# Patient Record
Sex: Female | Born: 1950 | Race: White | Hispanic: No | Marital: Married | Smoking: Never smoker
Health system: Southern US, Community
[De-identification: ages and names within clinical notes are randomized; demographics above are authoritative.]

## PROBLEM LIST (undated history)

## (undated) DIAGNOSIS — R030 Elevated blood-pressure reading, without diagnosis of hypertension: Secondary | ICD-10-CM

## (undated) DIAGNOSIS — F329 Major depressive disorder, single episode, unspecified: Secondary | ICD-10-CM

## (undated) DIAGNOSIS — Z8669 Personal history of other diseases of the nervous system and sense organs: Secondary | ICD-10-CM

## (undated) DIAGNOSIS — K5720 Diverticulitis of large intestine with perforation and abscess without bleeding: Secondary | ICD-10-CM

## (undated) DIAGNOSIS — F32A Depression, unspecified: Secondary | ICD-10-CM

## (undated) DIAGNOSIS — N938 Other specified abnormal uterine and vaginal bleeding: Secondary | ICD-10-CM

## (undated) DIAGNOSIS — Z8249 Family history of ischemic heart disease and other diseases of the circulatory system: Secondary | ICD-10-CM

## (undated) HISTORY — DX: Diverticulitis of large intestine with perforation and abscess without bleeding: K57.20

## (undated) HISTORY — DX: Elevated blood-pressure reading, without diagnosis of hypertension: R03.0

## (undated) HISTORY — DX: Depression, unspecified: F32.A

## (undated) HISTORY — DX: Personal history of other diseases of the nervous system and sense organs: Z86.69

## (undated) HISTORY — DX: Family history of ischemic heart disease and other diseases of the circulatory system: Z82.49

## (undated) HISTORY — DX: Major depressive disorder, single episode, unspecified: F32.9

## (undated) HISTORY — DX: Other specified abnormal uterine and vaginal bleeding: N93.8

## (undated) HISTORY — PX: DILATION AND CURETTAGE OF UTERUS: SHX78

## (undated) HISTORY — PX: APPENDECTOMY: SHX54

---

## 1999-08-10 ENCOUNTER — Encounter: Admission: RE | Admit: 1999-08-10 | Discharge: 1999-08-10 | Payer: Self-pay | Admitting: Family Medicine

## 1999-08-10 ENCOUNTER — Encounter: Payer: Self-pay | Admitting: Family Medicine

## 2000-08-22 ENCOUNTER — Encounter: Admission: RE | Admit: 2000-08-22 | Discharge: 2000-08-22 | Payer: Self-pay | Admitting: Family Medicine

## 2000-08-22 ENCOUNTER — Encounter: Payer: Self-pay | Admitting: Family Medicine

## 2000-10-10 ENCOUNTER — Encounter: Payer: Self-pay | Admitting: Urology

## 2000-10-10 ENCOUNTER — Encounter: Admission: RE | Admit: 2000-10-10 | Discharge: 2000-10-10 | Payer: Self-pay | Admitting: Urology

## 2001-10-19 ENCOUNTER — Encounter: Payer: Self-pay | Admitting: Family Medicine

## 2001-10-19 ENCOUNTER — Encounter: Admission: RE | Admit: 2001-10-19 | Discharge: 2001-10-19 | Payer: Self-pay | Admitting: Family Medicine

## 2002-01-14 ENCOUNTER — Encounter: Admission: RE | Admit: 2002-01-14 | Discharge: 2002-01-14 | Payer: Self-pay | Admitting: Family Medicine

## 2002-01-14 ENCOUNTER — Encounter: Payer: Self-pay | Admitting: Family Medicine

## 2002-02-22 ENCOUNTER — Encounter: Payer: Self-pay | Admitting: Family Medicine

## 2002-02-22 ENCOUNTER — Encounter: Admission: RE | Admit: 2002-02-22 | Discharge: 2002-02-22 | Payer: Self-pay | Admitting: Family Medicine

## 2002-10-28 ENCOUNTER — Encounter: Payer: Self-pay | Admitting: Family Medicine

## 2002-10-28 ENCOUNTER — Encounter: Admission: RE | Admit: 2002-10-28 | Discharge: 2002-10-28 | Payer: Self-pay | Admitting: Family Medicine

## 2002-10-30 ENCOUNTER — Encounter: Admission: RE | Admit: 2002-10-30 | Discharge: 2002-10-30 | Payer: Self-pay | Admitting: Family Medicine

## 2002-10-30 ENCOUNTER — Encounter: Payer: Self-pay | Admitting: Family Medicine

## 2002-12-25 ENCOUNTER — Other Ambulatory Visit: Admission: RE | Admit: 2002-12-25 | Discharge: 2002-12-25 | Payer: Self-pay | Admitting: Family Medicine

## 2003-11-13 ENCOUNTER — Encounter: Admission: RE | Admit: 2003-11-13 | Discharge: 2003-11-13 | Payer: Self-pay | Admitting: Family Medicine

## 2003-12-26 ENCOUNTER — Other Ambulatory Visit: Admission: RE | Admit: 2003-12-26 | Discharge: 2003-12-26 | Payer: Self-pay | Admitting: Family Medicine

## 2004-12-22 ENCOUNTER — Encounter: Admission: RE | Admit: 2004-12-22 | Discharge: 2004-12-22 | Payer: Self-pay | Admitting: Family Medicine

## 2004-12-28 ENCOUNTER — Other Ambulatory Visit: Admission: RE | Admit: 2004-12-28 | Discharge: 2004-12-28 | Payer: Self-pay | Admitting: Family Medicine

## 2005-11-03 ENCOUNTER — Other Ambulatory Visit: Admission: RE | Admit: 2005-11-03 | Discharge: 2005-11-03 | Payer: Self-pay | Admitting: Family Medicine

## 2006-11-09 ENCOUNTER — Other Ambulatory Visit: Admission: RE | Admit: 2006-11-09 | Discharge: 2006-11-09 | Payer: Self-pay | Admitting: Family Medicine

## 2006-11-16 ENCOUNTER — Encounter: Admission: RE | Admit: 2006-11-16 | Discharge: 2006-11-16 | Payer: Self-pay | Admitting: Family Medicine

## 2007-03-09 ENCOUNTER — Inpatient Hospital Stay (HOSPITAL_COMMUNITY): Admission: EM | Admit: 2007-03-09 | Discharge: 2007-03-14 | Payer: Self-pay | Admitting: Emergency Medicine

## 2007-03-09 ENCOUNTER — Encounter: Admission: RE | Admit: 2007-03-09 | Discharge: 2007-03-09 | Payer: Self-pay | Admitting: Family Medicine

## 2007-05-08 ENCOUNTER — Encounter: Admission: RE | Admit: 2007-05-08 | Discharge: 2007-05-08 | Payer: Self-pay | Admitting: Psychology

## 2007-06-20 HISTORY — PX: COLON RESECTION: SHX5231

## 2007-07-02 ENCOUNTER — Inpatient Hospital Stay (HOSPITAL_COMMUNITY): Admission: RE | Admit: 2007-07-02 | Discharge: 2007-07-06 | Payer: Self-pay | Admitting: General Surgery

## 2007-07-02 ENCOUNTER — Encounter (INDEPENDENT_AMBULATORY_CARE_PROVIDER_SITE_OTHER): Payer: Self-pay | Admitting: General Surgery

## 2007-09-20 HISTORY — PX: ABDOMINAL HERNIA REPAIR: SHX539

## 2007-11-28 ENCOUNTER — Other Ambulatory Visit: Admission: RE | Admit: 2007-11-28 | Discharge: 2007-11-28 | Payer: Self-pay | Admitting: Family Medicine

## 2008-02-07 ENCOUNTER — Encounter: Admission: RE | Admit: 2008-02-07 | Discharge: 2008-02-07 | Payer: Self-pay | Admitting: Family Medicine

## 2008-11-28 ENCOUNTER — Other Ambulatory Visit: Admission: RE | Admit: 2008-11-28 | Discharge: 2008-11-28 | Payer: Self-pay | Admitting: Family Medicine

## 2009-01-05 ENCOUNTER — Encounter: Admission: RE | Admit: 2009-01-05 | Discharge: 2009-01-05 | Payer: Self-pay | Admitting: Family Medicine

## 2009-12-28 ENCOUNTER — Other Ambulatory Visit: Admission: RE | Admit: 2009-12-28 | Discharge: 2009-12-28 | Payer: Self-pay | Admitting: Family Medicine

## 2010-02-19 ENCOUNTER — Ambulatory Visit (HOSPITAL_COMMUNITY): Admission: RE | Admit: 2010-02-19 | Discharge: 2010-02-19 | Payer: Self-pay | Admitting: General Surgery

## 2010-10-10 ENCOUNTER — Encounter: Payer: Self-pay | Admitting: Family Medicine

## 2010-12-06 LAB — DIFFERENTIAL
Eosinophils Absolute: 0.1 10*3/uL (ref 0.0–0.7)
Eosinophils Relative: 2 % (ref 0–5)
Lymphocytes Relative: 29 % (ref 12–46)
Lymphs Abs: 1.5 10*3/uL (ref 0.7–4.0)
Monocytes Absolute: 0.4 10*3/uL (ref 0.1–1.0)
Neutro Abs: 2.9 10*3/uL (ref 1.7–7.7)

## 2010-12-06 LAB — CBC
Hemoglobin: 12.1 g/dL (ref 12.0–15.0)
MCV: 93.7 fL (ref 78.0–100.0)
RBC: 3.86 MIL/uL — ABNORMAL LOW (ref 3.87–5.11)
RDW: 13.7 % (ref 11.5–15.5)
WBC: 5 10*3/uL (ref 4.0–10.5)

## 2011-01-13 ENCOUNTER — Other Ambulatory Visit: Payer: Self-pay | Admitting: Family Medicine

## 2011-01-13 DIAGNOSIS — Z1231 Encounter for screening mammogram for malignant neoplasm of breast: Secondary | ICD-10-CM

## 2011-01-14 ENCOUNTER — Other Ambulatory Visit: Payer: Self-pay | Admitting: Obstetrics and Gynecology

## 2011-01-14 DIAGNOSIS — Z1231 Encounter for screening mammogram for malignant neoplasm of breast: Secondary | ICD-10-CM

## 2011-01-17 ENCOUNTER — Ambulatory Visit
Admission: RE | Admit: 2011-01-17 | Discharge: 2011-01-17 | Disposition: A | Discharge status: Home / Self Care | Payer: 59 | Source: Ambulatory Visit | Attending: Family Medicine | Admitting: Family Medicine

## 2011-01-17 DIAGNOSIS — Z1231 Encounter for screening mammogram for malignant neoplasm of breast: Secondary | ICD-10-CM

## 2011-02-01 NOTE — Consult Note (Signed)
[name], [name]                 ACCOUNT NO.:  [id number]   MEDICAL RECORD NO.:  [id number]          PATIENT TYPE:  INP   LOCATION:  5005                         FACILITY:  [facility]   PHYSICIAN:  Cherylynn Ridges, M.D.    DATE OF BIRTH:  May 22, 1951   DATE OF CONSULTATION:  DATE OF DISCHARGE:                                 CONSULTATION   To Whom it May Concern:   Thank you for asking [place] to consult on [name], a 60 year old with a  history of acute diverticulitis confirmed by CT scan today with a  peridiverticular abscess.  According to the patient, the patient has had  the pain about 10 days.  It has progressively been getting worse.  She  has been known to have diverticulosis in the past with previous barium  enema and colonoscopy done by Dr. Russella Dar.  However, she has never had an  acute attack.  She has some chills but no undocumented fevers.  Came  into the emergency room today after being seen by Dr. Arvilla Market.  Was  started on p.o. ciprofloxacin and Flagyl for presumed diverticulitis.  This failed to work, and she came in today for further evaluation.   PAST MEDICAL HISTORY:  Significant for only depression primarily.  She  has no heart, renal, pulmonary disease.   PAST SURGICAL HISTORY:  She has had C-sections x2 and also an  appendectomy for ruptured appendicitis 11 years ago.   ALLERGIES:  SHE HAS NO KNOWN DRUG ALLERGIES.   CURRENT MEDICATIONS:  Include Wellbutrin, trazodone, and Prozac.   REVIEW OF SYSTEMS:  She has had diarrhea in the last several days.  No  blood in her stools.  No nausea or vomiting.   PHYSICAL EXAMINATION:  VITAL SIGNS:  On exam today she is afebrile.  Her  other vital signs are stable.  HEENT:  She is normocephalic and atraumatic and anicteric.  NECK:  Supple.  CHEST:  Clear to auscultation.  CARDIAC:  Regular rhythm and rate with no murmurs.  ABDOMEN:  Distended with hypoactive bowel sounds but some are present.  She has tenderness in the left  lower quadrant and referred tenderness to  the left lower quadrant from the right side.  RECTAL AND PELVIC:  Exams are not performed.   LABORATORY STUDIES:  Her white count is 11,000.  I have reviewed her CT  scan which shows a large area in the mid to distal sigmoid colon with  inflammation with constriction on the lumen and a peridiverticular  abscess.   IMPRESSION:  Acute diverticulitis with a peridiverticular abscess, both  of which appear to be compromised in the lumen of the sigmoid colon.  Although she is not fully obstructed, she is partially obstructed,  likely accounting for her diarrhea.   PLAN:  The plan is to have her admitted to the medicine service.  We  will follow along with her as currently she does not require surgical  intervention.  The abscess cavity is contained.  There is no free  perforation and therefore no need for acute surgical intervention.  I  have warned the patient however that if she should get worse during the  hospitalization or developed free air or evidence of peritonitis, an  urgent operation may be necessary.  She understands the risks of this,  and she will be admitted and started on IV antibiotics.  There is the  possibility that she may require percutaneous drainage of this abscess  also.      Cherylynn Ridges, M.D.  Electronically Signed     JOW/MEDQ  D:  03/09/2007  T:  03/10/2007  Job:  [id number]

## 2011-02-01 NOTE — Discharge Summary (Signed)
[name], [name]                 ACCOUNT NO.:  [id number]   MEDICAL RECORD NO.:  [id number]          PATIENT TYPE:  INP   LOCATION:  5005                         FACILITY:  [facility]   PHYSICIAN:  Kela Millin, M.D.DATE OF BIRTH:  1951-04-14   DATE OF ADMISSION:  03/09/2007  DATE OF DISCHARGE:  03/14/2007                         DISCHARGE SUMMARY - REFERRING   DISCHARGE DIAGNOSES:  1. Diverticulitis with peridiverticular abscess.  2. Depression.   CONSULTATION:  Surgery, Dr. Lindie Spruce.   PROCEDURES AND STUDIES:  1. CT scan of the abdomen and pelvis - diverticulitis with abscess and      microperforation.  2. Follow-up CT scan on March 13, 2007, - improving diverticulitis of      the sigmoid colon.   BRIEF HISTORY:  The patient is a 60 year old with the above listed  medical problems and a history of diverticulosis who presented with  complaints of abdominal pain.  She went to see her primary care  physician and was started on oral Cipro and Flagyl but her symptoms were  worsening, so she came to the ER.  It was reported that patient had had  a colonoscopy three years prior to this presentation which showed  diverticulosis but she had never had any diverticulitis.  She had a CT  scan of her abdomen done which showed the abscess and microperforation  and she was admitted for further evaluation and management.   Please see the dictated admission history and physical on April 08, 2007,  per Dr. Donette Larry for details of the admission physical examination and  laboratory data.   HOSPITAL COURSE:  PROBLEM #1 -  SIGMOID DIVERTICULITIS WITH  MICROPERFORATION AND PERIDIVERTICULAR ABSCESS:  The patient was kept  n.p.o. initially and placed on IV Cipro and Flagyl, hydrated with  intravenous fluids.  Surgery was consulted and [facility] saw the  patient and agreed with this management.  They followed and the patient  continued to improve.  A follow-up CT scan was done and the results as  stated above.  The patient's abdominal pain was improving, so her diet  was gradually advanced.  She was placed on analgesics for pain  management upon admission as well.  With the follow-up CT scan showing  improving diverticulitis and patient also clinically improved, her diet  was advanced to a low residue diet which she tolerated and was  discharged home on oral antibiotics.   PROBLEM #2 -  DEPRESSION:  She was maintained on her outpatient  medications as soon as she was tolerating p.o.   DISCHARGE MEDICATIONS:  1. Cipro 500 p.o. q.12h. for 10 more days.  2. Flagyl 500 mg one p.o. t.i.d.  3. Protonix 40 mg p.o. daily.  4. Percocet one to two p.o. q.4h. p.r.n.   FOLLOW UP CARE:  1. Dr. Lindie Spruce in four weeks.  2. Dr. Arvilla Market in one week.   CONDITION ON DISCHARGE:  Improved/stable.      Kela Millin, M.D.  Electronically Signed     ACV/MEDQ  D:  05/03/2007  T:  05/03/2007  Job:  [id number]   cc:  Donia Guiles, M.D.  Cherylynn Ridges, M.D.

## 2011-02-01 NOTE — Op Note (Signed)
NAMEKORAYMA, HAGWOOD                 ACCOUNT NO.:  [id number]   MEDICAL RECORD NO.:  [id number]          PATIENT TYPE:  INP   LOCATION:  1533                         FACILITY:  [facility]   PHYSICIAN:  Lennie Muckle, MD      DATE OF BIRTH:  August 19, 1951   DATE OF PROCEDURE:  07/02/2007  DATE OF DISCHARGE:                               OPERATIVE REPORT   PROCEDURE:  Sigmoid resection and hernia repair.   PREOPERATIVE DIAGNOSES:  1. Diverticulitis.  2. Incisional hernia.   POSTOPERATIVE DIAGNOSES:  1. Diverticulitis.  2. Incisional hernia.   SURGEON:  Lennie Muckle, M.D.   ASSISTANT:  Ollen Gross. Vernell Morgans, M.D.   ANESTHESIA:  General endotracheal anesthesia.   ESTIMATED BLOOD LOSS:  Approximately 50 mL.   COMPLICATIONS:  None immediate.   DRAINS:  No drains were placed.   FINDINGS:  Diverticula in the sigmoid colon.   SPECIMENS:  Sigmoid.   INDICATIONS FOR PROCEDURE:  Ms. Skalsky is a 60 year old female who had  had a previous hospitalization for diverticulitis with perforation in  June of 2008.  I had seen her in clinic for possible laparoscopic  sigmoid resection.  Due to her previous surgeries and perforation, it  was felt that she would be better suited for an open procedure.  She had  also had complaints of an incisional hernia.  Thus, I elected to perform  both procedures at the same time.  Informed consent was obtained prior  to the procedure.   DESCRIPTION OF PROCEDURE:  Ms. Schexnider was identified in the holding area  and taken to the operating suite.  She received preoperative antibiotics  of Mefoxin.  She was placed in the supine position and given general  endotracheal anesthesia.  After administration of anesthesia, she was  moved to a lithotomy position and placed in [place] stirrups.  Her rectum  and distal sigmoid colon were irrigated with saline and a mixture of  water and Betadine until clear.   A midline incision was then placed through her previous incisional scar  starting proximally above the scar, gaining entry into the abdominal  cavity.  There were adhesions of omentum to the abdominal wall.  These  were taken down with electrocautery.  The lateral attachments on the  sigmoid were identified and taken with the electrocautery and mobilized  distally to an area that was free of diverticula.  The dissection was  continued proximally, with care to visualize the ureter on the left  side.  The splenic flexure was then taken down with blunt dissection and  electrocautery.  I was able to gain enough length through taking down  the splenic flexure for laxity to perform the anastomosis in the pelvis.  There was an adhesion of the omentum around the transverse colon.  This  was taken down with electrocautery without difficulty.  I then picked an  area proximally on the left colon which was free of diverticulum and  disease.  Using a GIA 75 stapler, this was transected.  I also completed  a transection distally on the proximal rectum with  a GIA stapler.  Upon  inspection, there appeared to be enough laxity in the left colon to  perform a side-to-side anastomosis.  A stay suture was placed distally  with silk and proximally, and the enterotomies were placed to position  the stapler to perform the side-to-side anastomosis.  Upon firing, the  staple line was inspected.  There was no evidence of bleeding.  I then  closed our enterotomy sites with 3-0 Vicryl suture and performed a  second layer closure with 3-0 silk sutures.  The abdomen was then  irrigated.  A leak test was performed with Dr. Carolynne Edouard performing a  sigmoidoscopy to instill air d proximally into the anastomotic site.  There was no evidence of bubbling.  The abdomen was irrigated with  approximately 3 liters of saline.  There was no evidence of bleeding at  the end of the case.  The omentum was placed over the area of  anastomosis.  Closure was performed with looped 0 PDS suture.  The skin  was  reapproximated with staples.  Dry gauze and a dressing were placed  for final dressing.   The patient was then extubated, awakened, and transported to the  postanesthesia care unit in stable condition.      Lennie Muckle, MD  Electronically Signed     ALA/MEDQ  D:  07/02/2007  T:  07/02/2007  Job:  [id number]

## 2011-02-01 NOTE — H&P (Signed)
[name], [name]                 ACCOUNT NO.:  [id number]   MEDICAL RECORD NO.:  [id number]          PATIENT TYPE:  EMS   LOCATION:  MAJO                         FACILITY:  [facility]   PHYSICIAN:  Georgann Housekeeper, MD      DATE OF BIRTH:  1951/07/28   DATE OF ADMISSION:  03/09/2007  DATE OF DISCHARGE:                              HISTORY & PHYSICAL   PRIMARY CARE PHYSICIAN:  Dr. Arvilla Market at [facility].   CHIEF COMPLAINT:  Abdominal pain.   A 60 year old female with a history of depression and diverticulosis who  started having abdominal pains from about Tuesday last week.  Went to  see Dr. Arvilla Market on Wednesday.  Was started on p.o. Cipro and Flagyl for  diverticulitis, but she started getting worse and came into the ER with  worsening abdominal pain described at almost 10/10.  No bleeding, no  nausea, vomiting, no blood in the stools, no fevers.  The patient had  colonoscopy three years ago which showed diverticulosis and never had  any acute episode.  She denies any unusual food.  As far as in the ER,  she had CT scan which showed diverticulitis with abscess and  microperforation.  Admit for further care.   PAST MEDICAL HISTORY:  No known drug allergies.   CURRENT MEDICATIONS:  1. Wellbutrin 300 mg daily.  2. Prozac 20 mg daily.  3. Trazodone 100 mg q.h.s.  4. Activella one a day.   SOCIAL HISTORY:  No tobacco, alcohol.  She is married with two children.  Works for the [employer].   PHYSICAL EXAMINATION:  VITAL SIGNS:  Blood pressure 136/97, temperature  98.8, 95 pulse, 97% sats.  LUNGS:  Clear.  HEART:  S1, S2, no murmurs.  EXTREMITIES:  No edema.  ABDOMEN:  Soft, tender, left lower quadrant worse with some mild rebound  without guarding.  Good bowel sounds.   LABORATORY DATA:  White count 11,000, hemoglobin 12.5.  Sodium 134,  potassium 4, creatinine 0.9, and BUN 6.  UA was negative.   As far as the CT scan results as mentioned above.   IMPRESSION:  A  60 year old female with diverticulosis and depression  comes in with acute diverticulitis with microperforation abscess.   PLAN:  Admit.  IV fluids, NPO, IV Cipro, and Flagyl.  Pain medication  with Dilaudid 1 mg q.4-6 p.r.n.  IV Protonix 40 mg. Depression - hold  off medication for now.  Surgical consult - I did talk to Dr. Lindie Walker.  He  said to get conservative management.  If things get worse, might  consider surgical intervention.      Georgann Housekeeper, MD  Electronically Signed     KH/MEDQ  D:  03/09/2007  T:  03/10/2007  Job:  [id number]   cc:   [name], M.D.

## 2011-02-01 NOTE — Discharge Summary (Signed)
[name], [name]                 ACCOUNT NO.:  [id number]   MEDICAL RECORD NO.:  [id number]          PATIENT TYPE:  INP   LOCATION:  1533                         [facility]ACILITY[facility]   PHYSICIAN:  Lennie Muckle, MD      DATE OF BIRTH:  [birth year]   DATE OF ADMISSION:  07/02/2007  DATE OF DISCHARGE:  07/06/2007                               DISCHARGE SUMMARY   DIAGNOSES:  Sigmoid diverticulitis.   PROCEDURE:  Open sigmoid resection.   HOSPITAL COURSE:  [name] was hospitalized after her sigmoid resection  on July 02, 2007.  She was started on a clear-liquid diet the  following day.  Had one bout of emesis; however, was able to be advanced  to a regular diet without difficulty.  She is eating well today, has  pain well controlled with p.o. Percocet.  She has been weaned off her  oxygen.  Has been afebrile.  However, she was known to have erythema in  her incision on July 05, 2007.  The erythema extended today; thus, I  opened up the wound with a minimal amount of purulent drainage from the  lower portion.  The wound was packed with a dry gauze.  She was  instructed on how to take care of her wound with dressings changed twice  daily.  She was instructed to shower daily, wash her incision, do  packing twice daily with moist gauze.  Return to the clinic next week.   DISCHARGE MEDICATIONS:  1. Percocet 5/325 1-2 every 4 hours #45.  2. Augmentin 875 mg take 1 twice daily, #14.  3. Over-the-counter stool softener while on Percocet.  4. She will resume her home medications of Wellbutrin, Prozac, and      trazodone.   She is instructed to call over this weekend if she develops increased  erythema or pain at her incision site.      Lennie Muckle, MD  Electronically Signed     ALA/MEDQ  D:  07/06/2007  T:  07/07/2007  Job:  [id number]

## 2011-06-30 LAB — BASIC METABOLIC PANEL
BUN: 5 — ABNORMAL LOW
CO2: 27
CO2: 31
Chloride: 103
Chloride: 105
GFR calc Af Amer: >60
GFR calc Af Amer: >60
GFR calc non Af Amer: >60
Glucose, Bld: 107 — ABNORMAL HIGH
Potassium: 4.2
Sodium: 140

## 2011-06-30 LAB — ABO/RH: ABO/RH(D): O NEG

## 2011-06-30 LAB — DIFFERENTIAL
Lymphocytes Relative: 19
Lymphs Abs: 1.3
Monocytes Absolute: 0.5
Monocytes Relative: 8
Neutro Abs: 4.9
Neutrophils Relative %: 71

## 2011-06-30 LAB — URINE CULTURE

## 2011-06-30 LAB — PHOSPHORUS: Phosphorus: 3.3

## 2011-06-30 LAB — CBC: Platelets: 265

## 2011-06-30 LAB — TYPE AND SCREEN: Antibody Screen: NEGATIVE

## 2011-07-06 LAB — I-STAT 8, (EC8 V) (CONVERTED LAB)
BUN: 6
Bicarbonate: 28.1 — ABNORMAL HIGH
Glucose, Bld: 94
Hemoglobin: 14.3
Potassium: 4
TCO2: 29
pH, Ven: 7.409 — ABNORMAL HIGH

## 2011-07-06 LAB — DIFFERENTIAL
Basophils Absolute: 0
Basophils Absolute: 0
Basophils Relative: 0
Eosinophils Absolute: 0.1
Eosinophils Absolute: 0.1
Eosinophils Absolute: 0.2
Eosinophils Relative: 1
Eosinophils Relative: 1
Lymphs Abs: 1.7
Monocytes Absolute: 0.5
Monocytes Absolute: 0.8 — ABNORMAL HIGH
Monocytes Relative: 11
Neutro Abs: 3
Neutrophils Relative %: 55

## 2011-07-06 LAB — URINALYSIS, ROUTINE W REFLEX MICROSCOPIC
Glucose, UA: NEGATIVE
Leukocytes, UA: NEGATIVE
Nitrite: NEGATIVE
Protein, ur: NEGATIVE
Urobilinogen, UA: 0.2

## 2011-07-06 LAB — BASIC METABOLIC PANEL
BUN: 2 — ABNORMAL LOW
BUN: 4 — ABNORMAL LOW
CO2: 25
CO2: 28
Calcium: 7.9 — ABNORMAL LOW
Calcium: 8.5
Chloride: 104
Creatinine, Ser: 0.6
GFR calc Af Amer: >60
GFR calc Af Amer: >60
GFR calc non Af Amer: >60
GFR calc non Af Amer: >60
GFR calc non Af Amer: >60
Glucose, Bld: 77
Potassium: 3.5
Potassium: 3.8
Sodium: 137
Sodium: 138

## 2011-07-06 LAB — CBC
HCT: 33.4 — ABNORMAL LOW
HCT: 35.1 — ABNORMAL LOW
HCT: 36.7
Hemoglobin: 11.2 — ABNORMAL LOW
Hemoglobin: 11.7 — ABNORMAL LOW
Hemoglobin: 12.5
MCHC: 33.5
MCHC: 34.1
MCV: 91.7
MCV: 92.7
MCV: 92.8
Platelets: 286
Platelets: 302
Platelets: 347
RBC: 3.66 — ABNORMAL LOW
RBC: 3.78 — ABNORMAL LOW
RDW: 12.8
RDW: 13
WBC: 5.5

## 2011-07-06 LAB — CULTURE, BLOOD (ROUTINE X 2)
Culture: NO GROWTH
Culture: NO GROWTH

## 2011-07-06 LAB — POCT I-STAT CREATININE: Operator id: 270111

## 2011-07-06 LAB — URINE MICROSCOPIC-ADD ON

## 2013-07-11 ENCOUNTER — Other Ambulatory Visit (HOSPITAL_COMMUNITY)
Admission: RE | Admit: 2013-07-11 | Discharge: 2013-07-11 | Disposition: A | Discharge status: Home / Self Care | Payer: 59 | Source: Ambulatory Visit | Attending: Family Medicine | Admitting: Family Medicine

## 2013-07-11 ENCOUNTER — Other Ambulatory Visit: Payer: Self-pay | Admitting: Family Medicine

## 2013-07-11 DIAGNOSIS — Z124 Encounter for screening for malignant neoplasm of cervix: Secondary | ICD-10-CM | POA: Insufficient documentation

## 2016-04-20 ENCOUNTER — Other Ambulatory Visit: Payer: Self-pay | Admitting: Family Medicine

## 2016-04-20 DIAGNOSIS — Z1231 Encounter for screening mammogram for malignant neoplasm of breast: Secondary | ICD-10-CM

## 2016-05-04 ENCOUNTER — Ambulatory Visit: Payer: Self-pay

## 2016-06-01 DIAGNOSIS — M8588 Other specified disorders of bone density and structure, other site: Secondary | ICD-10-CM | POA: Diagnosis not present

## 2016-06-01 DIAGNOSIS — M810 Age-related osteoporosis without current pathological fracture: Secondary | ICD-10-CM | POA: Diagnosis not present

## 2016-08-03 DIAGNOSIS — Z131 Encounter for screening for diabetes mellitus: Secondary | ICD-10-CM | POA: Diagnosis not present

## 2016-08-03 DIAGNOSIS — F39 Unspecified mood [affective] disorder: Secondary | ICD-10-CM | POA: Diagnosis not present

## 2016-08-03 DIAGNOSIS — R311 Benign essential microscopic hematuria: Secondary | ICD-10-CM | POA: Diagnosis not present

## 2016-08-03 DIAGNOSIS — Z23 Encounter for immunization: Secondary | ICD-10-CM | POA: Diagnosis not present

## 2016-08-03 DIAGNOSIS — M1990 Unspecified osteoarthritis, unspecified site: Secondary | ICD-10-CM | POA: Diagnosis not present

## 2016-08-03 DIAGNOSIS — M810 Age-related osteoporosis without current pathological fracture: Secondary | ICD-10-CM | POA: Diagnosis not present

## 2016-08-03 DIAGNOSIS — Z0000 Encounter for general adult medical examination without abnormal findings: Secondary | ICD-10-CM | POA: Diagnosis not present

## 2016-08-03 DIAGNOSIS — E7800 Pure hypercholesterolemia, unspecified: Secondary | ICD-10-CM | POA: Diagnosis not present

## 2016-08-03 DIAGNOSIS — K5720 Diverticulitis of large intestine with perforation and abscess without bleeding: Secondary | ICD-10-CM | POA: Diagnosis not present

## 2016-11-09 ENCOUNTER — Ambulatory Visit: Payer: Self-pay | Admitting: Cardiology

## 2016-11-09 DIAGNOSIS — R0789 Other chest pain: Secondary | ICD-10-CM | POA: Diagnosis not present

## 2016-11-09 DIAGNOSIS — R0989 Other specified symptoms and signs involving the circulatory and respiratory systems: Secondary | ICD-10-CM | POA: Diagnosis not present

## 2016-11-11 ENCOUNTER — Encounter: Payer: Self-pay | Admitting: Cardiology

## 2016-11-14 ENCOUNTER — Encounter: Payer: Self-pay | Admitting: Cardiology

## 2016-11-14 ENCOUNTER — Ambulatory Visit (INDEPENDENT_AMBULATORY_CARE_PROVIDER_SITE_OTHER): Payer: Medicare Other | Admitting: Cardiology

## 2016-11-14 VITALS — BP 146/88 | HR 71 | Ht 62.0 in | Wt 176.2 lb

## 2016-11-14 DIAGNOSIS — R030 Elevated blood-pressure reading, without diagnosis of hypertension: Secondary | ICD-10-CM | POA: Diagnosis not present

## 2016-11-14 DIAGNOSIS — R079 Chest pain, unspecified: Secondary | ICD-10-CM

## 2016-11-14 DIAGNOSIS — Z8249 Family history of ischemic heart disease and other diseases of the circulatory system: Secondary | ICD-10-CM

## 2016-11-14 HISTORY — DX: Elevated blood-pressure reading, without diagnosis of hypertension: R03.0

## 2016-11-14 HISTORY — DX: Family history of ischemic heart disease and other diseases of the circulatory system: Z82.49

## 2016-11-14 NOTE — Progress Notes (Signed)
Cardiology Office Note    Date:  11/14/2016   ID:  [name], DOB [birth year], MRN [id number]  PCP:  Astrid DivineGRIFFIN,ELAINE COLLINS, MD  Cardiologist:  Armanda Magicraci Turner, MD   Chief Complaint  Patient presents with  . Chest Pain    History of Present Illness:  [name] is a 66 y.o. female with a history of depression who is referred here for evaluation of chest pain.  She awakened a few days ago at 3am with pressure and tightness in her chest with no associated belching or sour taste in her mouth. She does not think that she had dinner the night before.   She noticed that her heart rate was also 120bpm while lying in bed but was no irregular.  She had associated nausea or diaphoresis.  She felt the same feeling the next day and felt like she was coming down with a chest cold.  Since then she has continued to have pressure in her chest intermittently when she is laying down and sometimes during the day.  She is under a lot of stress.  Her symptoms are predominantly nonexertional.  EKG at PCP office showed nonspecific T wave abnormality.  Her last LDL was 146 with HDL 94.  She is a nonsmoker but grew up around second hand smoke.  Her Dad died from a ruptured AAA. She rarely will have some LE edema in her ankles. She denies any DOE, dizziness or syncope.     Past Medical History:  Diagnosis Date  . Depression   . Diverticulitis of colon with perforation   . DUB (dysfunctional uterine bleeding)    MENOPAUSAL  . Elevated blood pressure reading 11/14/2016  . Family history of abdominal aortic aneurysm (AAA) 11/14/2016  . History of migraine headaches     Past Surgical History:  Procedure Laterality Date  . ABDOMINAL HERNIA REPAIR  2009  . APPENDECTOMY    . CESAREAN SECTION     x2  . COLON RESECTION  06/2007   FOR DIVERTICULITIS WITH LEAK  . DILATION AND CURETTAGE OF UTERUS      Current Medications: No outpatient prescriptions have been marked as taking for the 11/14/16 encounter (Office  Visit) with Quintella Reichertraci R Turner, MD.    Allergies:   Codeine; Lexapro [escitalopram]; and Vitamin d analogs   Social History   Social History  . Marital status: Married    Spouse name: N/A  . Number of children: N/A  . Years of education: N/A   Social History Main Topics  . Smoking status: Never Smoker  . Smokeless tobacco: Never Used  . Alcohol use Yes  . Drug use: No  . Sexual activity: Not Asked   Other Topics Concern  . None   Social History Narrative  . None     Family History:  The patient's family history includes AAA (abdominal aortic aneurysm) in her father and mother; COPD in her brother; CVA in her paternal grandmother; Epilepsy in her son; Heart attack in her paternal grandfather; Hypercholesterolemia in her mother.   ROS:   Please see the history of present illness.    ROS All other systems reviewed and are negative.  No flowsheet data found.     PHYSICAL EXAM:   VS:  BP (!) 146/88   Pulse 71   Ht 5\' 2"  (1.575 m)   Wt 176 lb 3.2 oz (79.9 kg)   BMI 32.23 kg/m    GEN: Well nourished, well developed, in no acute distress  HEENT: normal  Neck: no JVD, carotid bruits, or masses Cardiac: RRR; no murmurs, rubs, or gallops,no edema.  Intact distal pulses bilaterally.  Respiratory:  clear to auscultation bilaterally, normal work of breathing GI: soft, nontender, nondistended, + BS MS: no deformity or atrophy  Skin: warm and dry, no rash Neuro:  Alert and Oriented x 3, Strength and sensation are intact Psych: euthymic mood, full affect  Wt Readings from Last 3 Encounters:  11/14/16 176 lb 3.2 oz (79.9 kg)      Studies/Labs Reviewed:   EKG:  EKG is ordered today.  The ekg ordered today demonstrates NSR with nonspecific T wave abnormality and LAFB  Recent Labs: No results found for requested labs within last 8760 hours.   Lipid Panel No results found for: CHOL, TRIG, HDL, CHOLHDL, VLDL, LDLCALC, LDLDIRECT  Additional studies/ records that were  reviewed today include:  Office visit notes from PCP    ASSESSMENT:    1. Chest pain, unspecified type   2. Family history of abdominal aortic aneurysm (AAA)   3. Elevated blood pressure reading      PLAN:  In order of problems listed above:  1. Chest pain with typical and atypical features.  I suspect this may be related to GERD.  She has very little CRFs which include post menopausal state and family history of vascular disease.  She never smoked but was exposed to extensive second hand smoke.  I will get a 2D echo to assess LVF and stress myoview to rule out ischemia.  2. Family history of AAA - her father died of a ruptured AAA.  I will get a screening Abdominal US 3. Elevated BP - She says that it was normal at last OV with PCP    Medication Adjustments/Labs and Tests Ordered: Current medicines are reviewed at length with the patient today.  Concerns regarding medicines are outlined above.  Medication changes, Labs and Tests ordered today are listed in the Patient Instructions below.  There are no Patient Instructions on file for this visit.   Signed, Armanda Magic, MD  11/14/2016 2:24 PM    [facility] [address] [place], [place], [place]  [address] Phone: [phone]; Fax: [phone]

## 2016-11-14 NOTE — Patient Instructions (Signed)
Medication Instructions:  Your physician recommends that you continue on your current medications as directed. Please refer to the Current Medication list given to you today.   Labwork: None  Testing/Procedures: Your physician has requested that you have an echocardiogram. Echocardiography is a painless test that uses sound waves to create images of your heart. It provides your doctor with information about the size and shape of your heart and how well your heart's chambers and valves are working. This procedure takes approximately one hour. There are no restrictions for this procedure.   Your physician has requested that you have an abdominal aorta duplex. During this test, an ultrasound is used to evaluate the aorta. Allow 30 minutes for this exam. Do not eat after midnight the day before and avoid carbonated beverages  Dr. Mayford Knifeurner recommends you have a NUCLEAR STRESS TEST.  Follow-Up: Your physician recommends that you schedule a follow-up appointment AS NEEDED with Dr. Mayford Knifeurner pending study results.  Any Other Special Instructions Will Be Listed Below (If Applicable).     If you need a refill on your cardiac medications before your next appointment, please call your pharmacy.

## 2016-11-24 ENCOUNTER — Telehealth (HOSPITAL_COMMUNITY): Payer: Self-pay | Admitting: *Deleted

## 2016-11-24 NOTE — Telephone Encounter (Signed)
Left message on voicemail per DPR in reference to upcoming appointment scheduled on 11/24/16 with detailed instructions given per Myocardial Perfusion Study Information Sheet for the test. LM to arrive 15 minutes early, and that it is imperative to arrive on time for appointment to keep from having the test rescheduled. If you need to cancel or reschedule your appointment, please call the office within 24 hours of your appointment. Failure to do so may result in a cancellation of your appointment, and a $50 no show fee. Phone number given for call back for any questions. Taran Hable Jacqueline    

## 2016-11-29 ENCOUNTER — Other Ambulatory Visit (HOSPITAL_COMMUNITY): Payer: Medicare Other

## 2016-11-29 ENCOUNTER — Encounter (HOSPITAL_COMMUNITY): Payer: Medicare Other

## 2016-12-05 ENCOUNTER — Telehealth (HOSPITAL_COMMUNITY): Payer: Self-pay | Admitting: *Deleted

## 2016-12-05 ENCOUNTER — Ambulatory Visit (HOSPITAL_COMMUNITY)
Admission: RE | Admit: 2016-12-05 | Discharge: 2016-12-05 | Disposition: A | Discharge status: Home / Self Care | Payer: Medicare Other | Source: Ambulatory Visit | Attending: Cardiovascular Disease | Admitting: Cardiovascular Disease

## 2016-12-05 DIAGNOSIS — I771 Stricture of artery: Secondary | ICD-10-CM | POA: Diagnosis not present

## 2016-12-05 DIAGNOSIS — Z8249 Family history of ischemic heart disease and other diseases of the circulatory system: Secondary | ICD-10-CM | POA: Diagnosis not present

## 2016-12-05 DIAGNOSIS — I700 Atherosclerosis of aorta: Secondary | ICD-10-CM | POA: Insufficient documentation

## 2016-12-05 DIAGNOSIS — R030 Elevated blood-pressure reading, without diagnosis of hypertension: Secondary | ICD-10-CM

## 2016-12-05 NOTE — Telephone Encounter (Signed)
Patient given detailed instructions per Myocardial Perfusion Study Information Sheet for the test on 12/07/16 at 0745. Patient notified to arrive 15 minutes early and that it is imperative to arrive on time for appointment to keep from having the test rescheduled.  If you need to cancel or reschedule your appointment, please call the office within 24 hours of your appointment. Failure to do so may result in a cancellation of your appointment, and a $50 no show fee. Patient verbalized understanding.Zabdi Mis, Adelene IdlerCynthia W

## 2016-12-07 ENCOUNTER — Ambulatory Visit (HOSPITAL_COMMUNITY): Payer: Medicare Other | Attending: Cardiology

## 2016-12-07 ENCOUNTER — Other Ambulatory Visit: Payer: Self-pay

## 2016-12-07 ENCOUNTER — Ambulatory Visit (HOSPITAL_BASED_OUTPATIENT_CLINIC_OR_DEPARTMENT_OTHER): Payer: Medicare Other

## 2016-12-07 DIAGNOSIS — Z8249 Family history of ischemic heart disease and other diseases of the circulatory system: Secondary | ICD-10-CM | POA: Insufficient documentation

## 2016-12-07 DIAGNOSIS — R079 Chest pain, unspecified: Secondary | ICD-10-CM

## 2016-12-07 DIAGNOSIS — I081 Rheumatic disorders of both mitral and tricuspid valves: Secondary | ICD-10-CM | POA: Insufficient documentation

## 2016-12-07 LAB — MYOCARDIAL PERFUSION IMAGING
CHL CUP NUCLEAR SDS: 1
CHL CUP NUCLEAR SRS: 1
CHL CUP NUCLEAR SSS: 2
CHL CUP RESTING HR STRESS: 67 {beats}/min
CSEPED: 5 min
CSEPEW: 7 METS
CSEPHR: 105 %
CSEPPHR: 164 {beats}/min
Exercise duration (sec): 1 s
LV dias vol: 88 mL (ref 46–106)
LV sys vol: 34 mL
MPHR: 155 {beats}/min
RATE: 0.35
TID: 0.86

## 2016-12-07 MED ORDER — TECHNETIUM TC 99M TETROFOSMIN IV KIT
10.3000 | PACK | Freq: Once | INTRAVENOUS | Status: AC | PRN
  Administered 2016-12-07: 10.3 via INTRAVENOUS
  Filled 2016-12-07: qty 11

## 2016-12-07 MED ORDER — TECHNETIUM TC 99M TETROFOSMIN IV KIT
32.1000 | PACK | Freq: Once | INTRAVENOUS | Status: AC | PRN
  Administered 2016-12-07: 32.1 via INTRAVENOUS
  Filled 2016-12-07: qty 33

## 2016-12-09 ENCOUNTER — Telehealth: Payer: Self-pay

## 2016-12-09 DIAGNOSIS — I5189 Other ill-defined heart diseases: Secondary | ICD-10-CM

## 2016-12-09 NOTE — Telephone Encounter (Signed)
-----   Message from Quintella Reichertraci R Turner, MD sent at 12/09/2016  1:28 PM EDT ----- No significant PVD  Traci ----- Message ----- From: Henrietta DineKathryn A Leyanna Bittman, RN Sent: 12/09/2016  10:52 AM To: Quintella Reichertraci R Turner, MD  I'm not sure if these results were sent to you - I never received them. Just making sure!

## 2016-12-09 NOTE — Telephone Encounter (Signed)
Notes recorded by Quintella Reichertraci R Turner, MD on 12/07/2016 at 10:48 PM EDT Please have her come in for BNP   Informed patient of results and verbal understanding expressed.  BNP scheduled Wednesday. Patient agrees with treatment plan.

## 2016-12-09 NOTE — Telephone Encounter (Signed)
-----   Message from Quintella Reichertraci R Turner, MD sent at 12/07/2016 10:48 PM EDT ----- Normal LVF with mild LVH and increased LV filling pressure, mild MR/TR

## 2016-12-14 ENCOUNTER — Other Ambulatory Visit: Payer: Medicare Other

## 2017-02-14 ENCOUNTER — Other Ambulatory Visit: Payer: Self-pay | Admitting: Family Medicine

## 2017-02-14 ENCOUNTER — Other Ambulatory Visit (HOSPITAL_COMMUNITY)
Admission: RE | Admit: 2017-02-14 | Discharge: 2017-02-14 | Disposition: A | Discharge status: Home / Self Care | Payer: Medicare Other | Source: Ambulatory Visit | Attending: Family Medicine | Admitting: Family Medicine

## 2017-02-14 DIAGNOSIS — Z124 Encounter for screening for malignant neoplasm of cervix: Secondary | ICD-10-CM | POA: Diagnosis not present

## 2017-02-14 DIAGNOSIS — N939 Abnormal uterine and vaginal bleeding, unspecified: Secondary | ICD-10-CM | POA: Diagnosis not present

## 2017-02-19 LAB — CYTOLOGY - PAP: Diagnosis: NEGATIVE

## 2017-08-08 ENCOUNTER — Ambulatory Visit
Admission: RE | Admit: 2017-08-08 | Discharge: 2017-08-08 | Disposition: A | Discharge status: Home / Self Care | Payer: Medicare Other | Source: Ambulatory Visit | Attending: Family Medicine | Admitting: Family Medicine

## 2017-08-08 ENCOUNTER — Other Ambulatory Visit: Payer: Self-pay | Admitting: Family Medicine

## 2017-08-08 DIAGNOSIS — Z1231 Encounter for screening mammogram for malignant neoplasm of breast: Secondary | ICD-10-CM

## 2017-08-17 DIAGNOSIS — Z1389 Encounter for screening for other disorder: Secondary | ICD-10-CM | POA: Diagnosis not present

## 2017-08-17 DIAGNOSIS — Z131 Encounter for screening for diabetes mellitus: Secondary | ICD-10-CM | POA: Diagnosis not present

## 2017-08-17 DIAGNOSIS — M1990 Unspecified osteoarthritis, unspecified site: Secondary | ICD-10-CM | POA: Diagnosis not present

## 2017-08-17 DIAGNOSIS — Z23 Encounter for immunization: Secondary | ICD-10-CM | POA: Diagnosis not present

## 2017-08-17 DIAGNOSIS — M810 Age-related osteoporosis without current pathological fracture: Secondary | ICD-10-CM | POA: Diagnosis not present

## 2017-08-17 DIAGNOSIS — E7800 Pure hypercholesterolemia, unspecified: Secondary | ICD-10-CM | POA: Diagnosis not present

## 2017-08-17 DIAGNOSIS — N950 Postmenopausal bleeding: Secondary | ICD-10-CM | POA: Diagnosis not present

## 2017-08-17 DIAGNOSIS — K5720 Diverticulitis of large intestine with perforation and abscess without bleeding: Secondary | ICD-10-CM | POA: Diagnosis not present

## 2017-08-17 DIAGNOSIS — Z1159 Encounter for screening for other viral diseases: Secondary | ICD-10-CM | POA: Diagnosis not present

## 2017-08-17 DIAGNOSIS — Z0000 Encounter for general adult medical examination without abnormal findings: Secondary | ICD-10-CM | POA: Diagnosis not present

## 2017-08-17 DIAGNOSIS — F39 Unspecified mood [affective] disorder: Secondary | ICD-10-CM | POA: Diagnosis not present

## 2017-08-17 DIAGNOSIS — G4730 Sleep apnea, unspecified: Secondary | ICD-10-CM | POA: Diagnosis not present

## 2017-08-31 DIAGNOSIS — N950 Postmenopausal bleeding: Secondary | ICD-10-CM | POA: Diagnosis not present

## 2017-09-05 DIAGNOSIS — G4733 Obstructive sleep apnea (adult) (pediatric): Secondary | ICD-10-CM | POA: Diagnosis not present

## 2017-09-07 DIAGNOSIS — Z1211 Encounter for screening for malignant neoplasm of colon: Secondary | ICD-10-CM | POA: Diagnosis not present

## 2017-09-07 DIAGNOSIS — K640 First degree hemorrhoids: Secondary | ICD-10-CM | POA: Diagnosis not present

## 2017-09-07 DIAGNOSIS — K5730 Diverticulosis of large intestine without perforation or abscess without bleeding: Secondary | ICD-10-CM | POA: Diagnosis not present

## 2017-09-07 DIAGNOSIS — Z980 Intestinal bypass and anastomosis status: Secondary | ICD-10-CM | POA: Diagnosis not present

## 2017-09-25 DIAGNOSIS — N950 Postmenopausal bleeding: Secondary | ICD-10-CM | POA: Diagnosis not present

## 2017-09-28 DIAGNOSIS — F39 Unspecified mood [affective] disorder: Secondary | ICD-10-CM | POA: Diagnosis not present

## 2017-09-28 DIAGNOSIS — J069 Acute upper respiratory infection, unspecified: Secondary | ICD-10-CM | POA: Diagnosis not present

## 2017-11-21 DIAGNOSIS — E559 Vitamin D deficiency, unspecified: Secondary | ICD-10-CM | POA: Diagnosis not present

## 2018-02-05 DIAGNOSIS — Z6833 Body mass index (BMI) 33.0-33.9, adult: Secondary | ICD-10-CM | POA: Diagnosis not present

## 2018-02-05 DIAGNOSIS — H9201 Otalgia, right ear: Secondary | ICD-10-CM | POA: Diagnosis not present

## 2018-02-14 DIAGNOSIS — F39 Unspecified mood [affective] disorder: Secondary | ICD-10-CM | POA: Diagnosis not present

## 2018-03-27 DIAGNOSIS — H9209 Otalgia, unspecified ear: Secondary | ICD-10-CM | POA: Diagnosis not present

## 2018-03-27 DIAGNOSIS — R51 Headache: Secondary | ICD-10-CM | POA: Diagnosis not present

## 2018-06-12 DIAGNOSIS — M810 Age-related osteoporosis without current pathological fracture: Secondary | ICD-10-CM | POA: Diagnosis not present

## 2018-07-27 DIAGNOSIS — Z23 Encounter for immunization: Secondary | ICD-10-CM | POA: Diagnosis not present

## 2018-09-14 ENCOUNTER — Other Ambulatory Visit: Payer: Self-pay | Admitting: Family Medicine

## 2018-09-14 DIAGNOSIS — M2550 Pain in unspecified joint: Secondary | ICD-10-CM | POA: Diagnosis not present

## 2018-09-14 DIAGNOSIS — F39 Unspecified mood [affective] disorder: Secondary | ICD-10-CM | POA: Diagnosis not present

## 2018-09-14 DIAGNOSIS — Z0000 Encounter for general adult medical examination without abnormal findings: Secondary | ICD-10-CM | POA: Diagnosis not present

## 2018-09-14 DIAGNOSIS — E559 Vitamin D deficiency, unspecified: Secondary | ICD-10-CM | POA: Diagnosis not present

## 2018-09-14 DIAGNOSIS — E7800 Pure hypercholesterolemia, unspecified: Secondary | ICD-10-CM | POA: Diagnosis not present

## 2018-09-14 DIAGNOSIS — M1990 Unspecified osteoarthritis, unspecified site: Secondary | ICD-10-CM | POA: Diagnosis not present

## 2018-09-14 DIAGNOSIS — R197 Diarrhea, unspecified: Secondary | ICD-10-CM | POA: Diagnosis not present

## 2018-09-14 DIAGNOSIS — M810 Age-related osteoporosis without current pathological fracture: Secondary | ICD-10-CM | POA: Diagnosis not present

## 2018-09-14 DIAGNOSIS — G4733 Obstructive sleep apnea (adult) (pediatric): Secondary | ICD-10-CM | POA: Diagnosis not present

## 2018-09-14 DIAGNOSIS — Z1231 Encounter for screening mammogram for malignant neoplasm of breast: Secondary | ICD-10-CM

## 2018-10-02 DIAGNOSIS — G43909 Migraine, unspecified, not intractable, without status migrainosus: Secondary | ICD-10-CM | POA: Diagnosis not present

## 2018-10-02 DIAGNOSIS — R768 Other specified abnormal immunological findings in serum: Secondary | ICD-10-CM | POA: Diagnosis not present

## 2018-10-02 DIAGNOSIS — M064 Inflammatory polyarthropathy: Secondary | ICD-10-CM | POA: Diagnosis not present

## 2018-10-02 DIAGNOSIS — M5030 Other cervical disc degeneration, unspecified cervical region: Secondary | ICD-10-CM | POA: Diagnosis not present

## 2018-10-02 DIAGNOSIS — M79641 Pain in right hand: Secondary | ICD-10-CM | POA: Diagnosis not present

## 2018-10-02 DIAGNOSIS — M19042 Primary osteoarthritis, left hand: Secondary | ICD-10-CM | POA: Diagnosis not present

## 2018-10-02 DIAGNOSIS — M542 Cervicalgia: Secondary | ICD-10-CM | POA: Diagnosis not present

## 2018-10-02 DIAGNOSIS — M19041 Primary osteoarthritis, right hand: Secondary | ICD-10-CM | POA: Diagnosis not present

## 2018-10-02 DIAGNOSIS — M47812 Spondylosis without myelopathy or radiculopathy, cervical region: Secondary | ICD-10-CM | POA: Diagnosis not present

## 2018-10-02 DIAGNOSIS — E7800 Pure hypercholesterolemia, unspecified: Secondary | ICD-10-CM | POA: Diagnosis not present

## 2018-10-02 DIAGNOSIS — M1990 Unspecified osteoarthritis, unspecified site: Secondary | ICD-10-CM | POA: Diagnosis not present

## 2018-10-02 DIAGNOSIS — M79642 Pain in left hand: Secondary | ICD-10-CM | POA: Diagnosis not present

## 2018-10-02 DIAGNOSIS — M2550 Pain in unspecified joint: Secondary | ICD-10-CM | POA: Diagnosis not present

## 2018-10-02 DIAGNOSIS — E559 Vitamin D deficiency, unspecified: Secondary | ICD-10-CM | POA: Diagnosis not present

## 2018-10-17 ENCOUNTER — Ambulatory Visit
Admission: RE | Admit: 2018-10-17 | Discharge: 2018-10-17 | Disposition: A | Discharge status: Home / Self Care | Payer: Medicare Other | Source: Ambulatory Visit | Attending: Family Medicine | Admitting: Family Medicine

## 2018-10-17 DIAGNOSIS — Z1231 Encounter for screening mammogram for malignant neoplasm of breast: Secondary | ICD-10-CM | POA: Diagnosis not present

## 2018-10-18 DIAGNOSIS — M5030 Other cervical disc degeneration, unspecified cervical region: Secondary | ICD-10-CM | POA: Diagnosis not present

## 2018-10-18 DIAGNOSIS — M542 Cervicalgia: Secondary | ICD-10-CM | POA: Diagnosis not present

## 2018-10-18 DIAGNOSIS — M064 Inflammatory polyarthropathy: Secondary | ICD-10-CM | POA: Diagnosis not present

## 2018-10-18 DIAGNOSIS — M2550 Pain in unspecified joint: Secondary | ICD-10-CM | POA: Diagnosis not present

## 2018-10-18 DIAGNOSIS — R768 Other specified abnormal immunological findings in serum: Secondary | ICD-10-CM | POA: Diagnosis not present

## 2018-10-18 DIAGNOSIS — G43909 Migraine, unspecified, not intractable, without status migrainosus: Secondary | ICD-10-CM | POA: Diagnosis not present

## 2018-10-18 DIAGNOSIS — E559 Vitamin D deficiency, unspecified: Secondary | ICD-10-CM | POA: Diagnosis not present

## 2018-10-18 DIAGNOSIS — M1990 Unspecified osteoarthritis, unspecified site: Secondary | ICD-10-CM | POA: Diagnosis not present

## 2018-10-18 DIAGNOSIS — E7800 Pure hypercholesterolemia, unspecified: Secondary | ICD-10-CM | POA: Diagnosis not present

## 2019-01-30 IMAGING — MG 2D DIGITAL SCREENING BILATERAL MAMMOGRAM WITH CAD AND ADJUNCT TO
8 of 12 series · 8 of 28 positions shown · non-contrast
Comparison: Previous exam(s).

CLINICAL DATA: Screening.

EXAM:
2D DIGITAL SCREENING BILATERAL MAMMOGRAM WITH CAD AND ADJUNCT TOMO

[L MLO synth-2D]
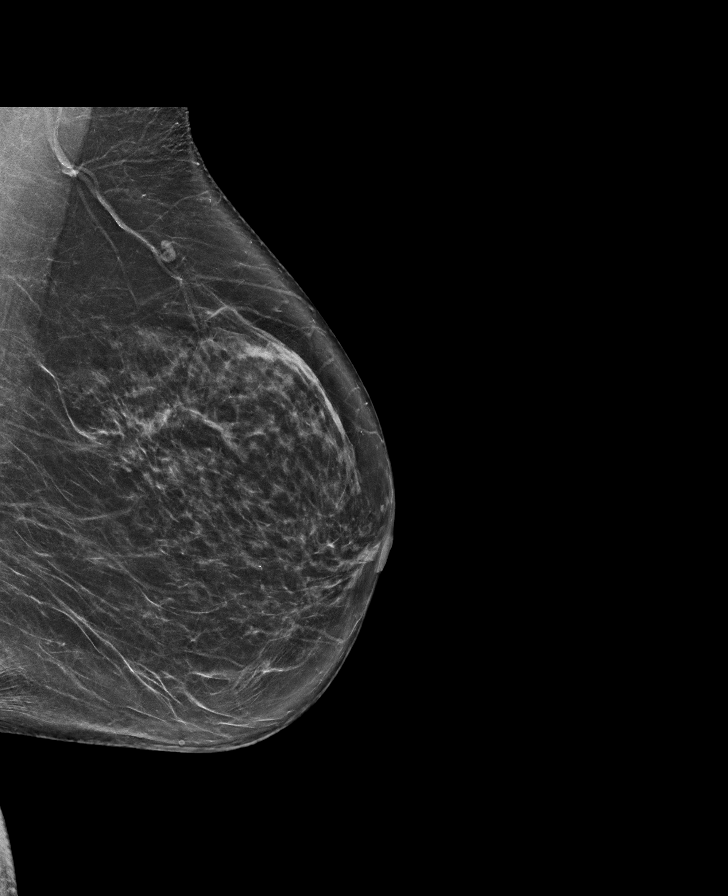

[R CC]
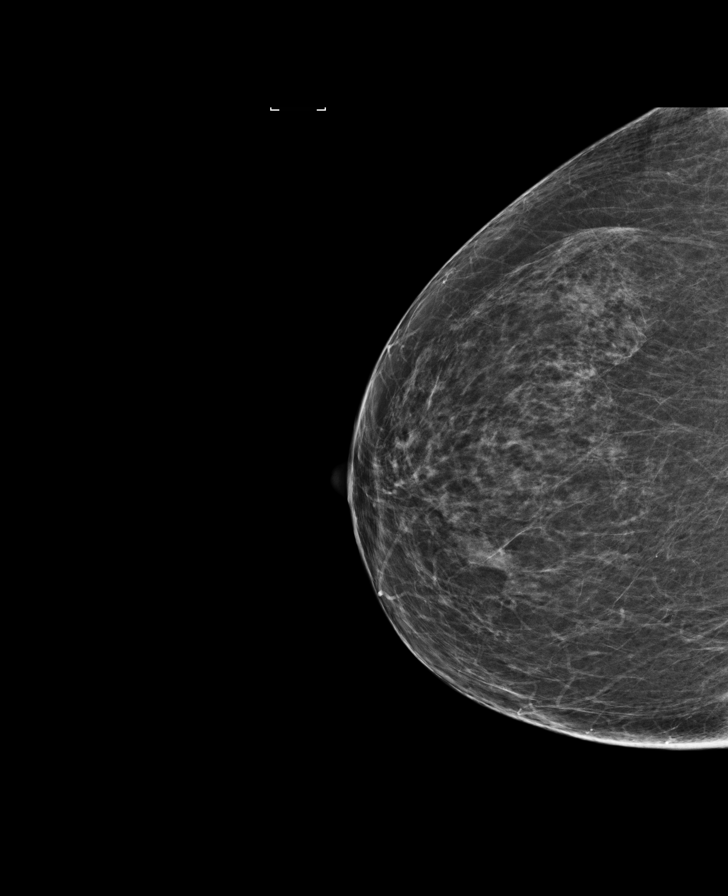

[L MLO]
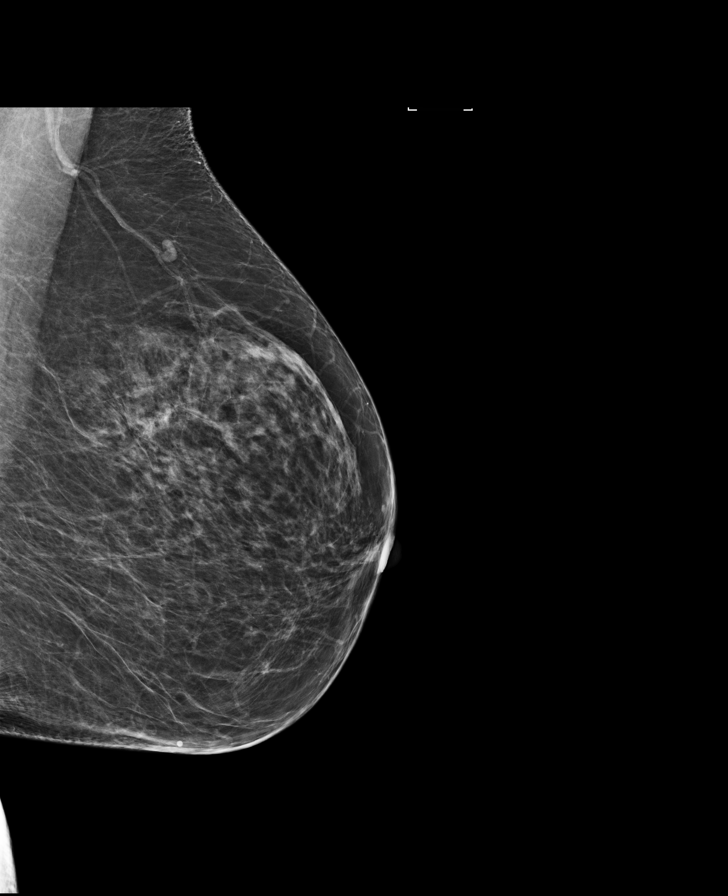

[R MLO]
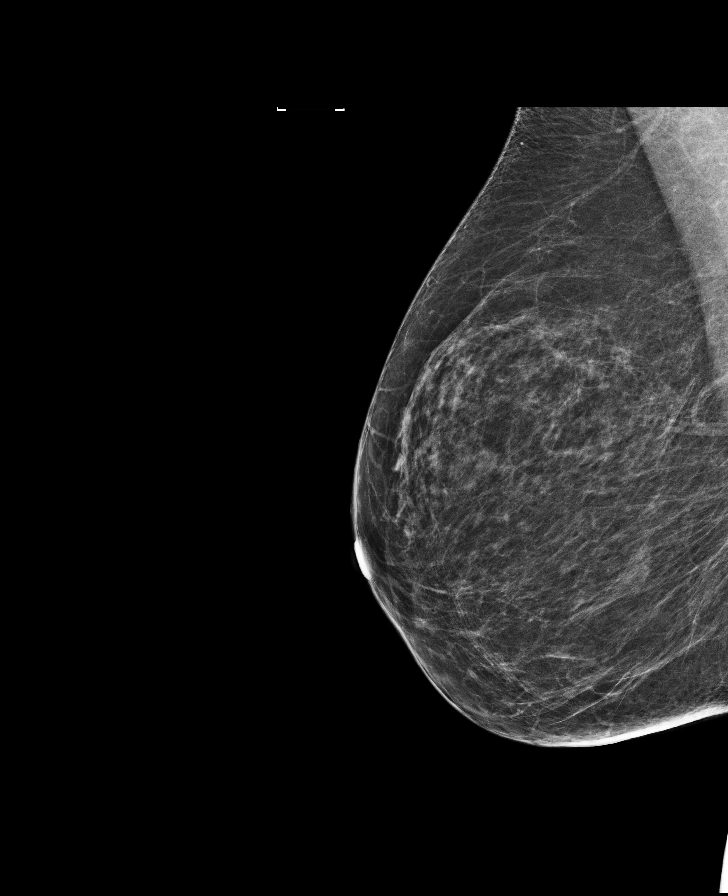

[R CC synth-2D]
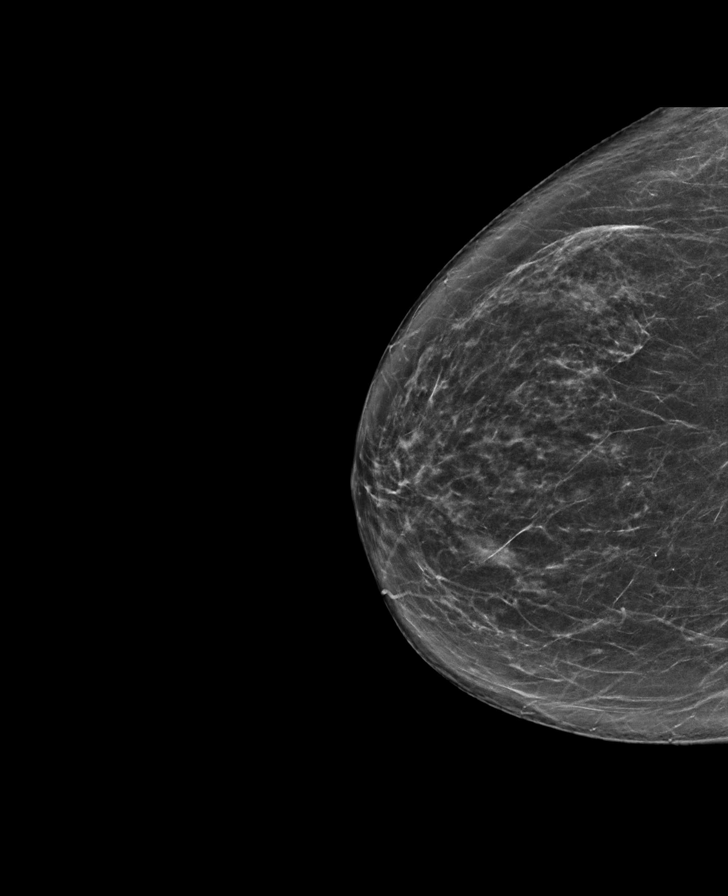

[L CC synth-2D]
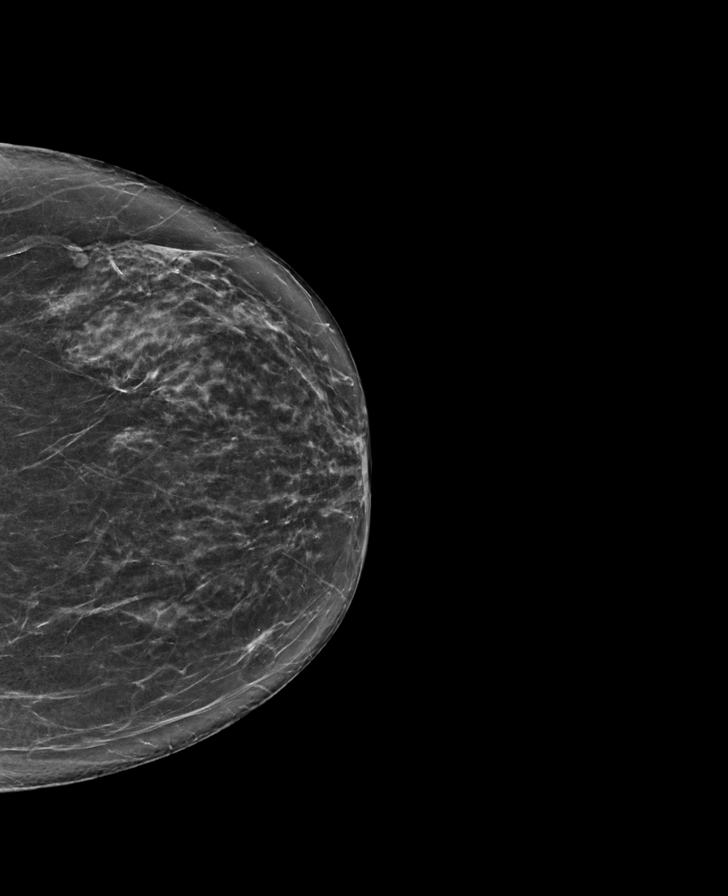

[R MLO synth-2D]
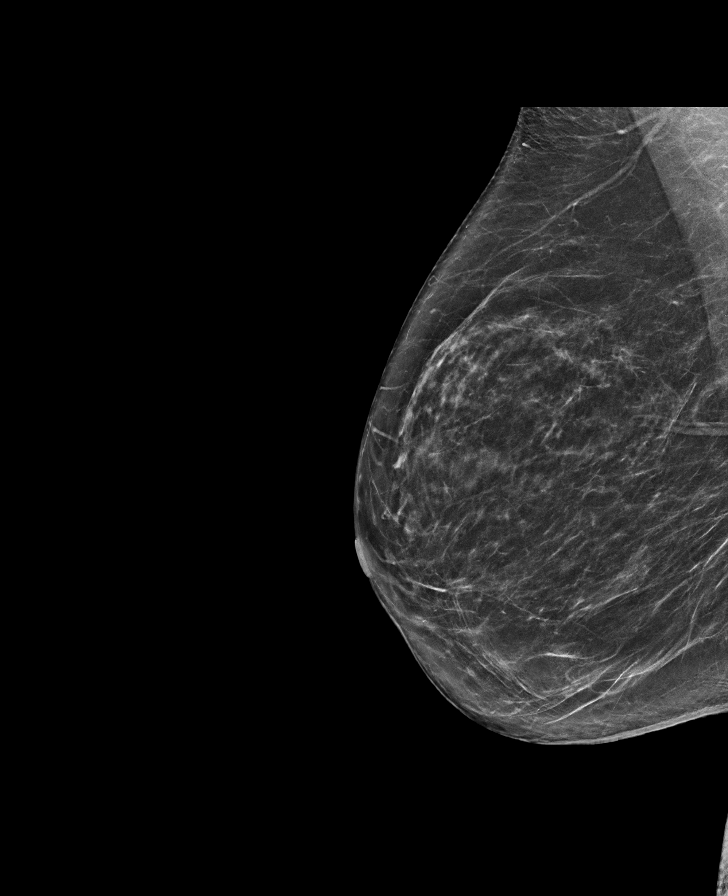

[L CC]
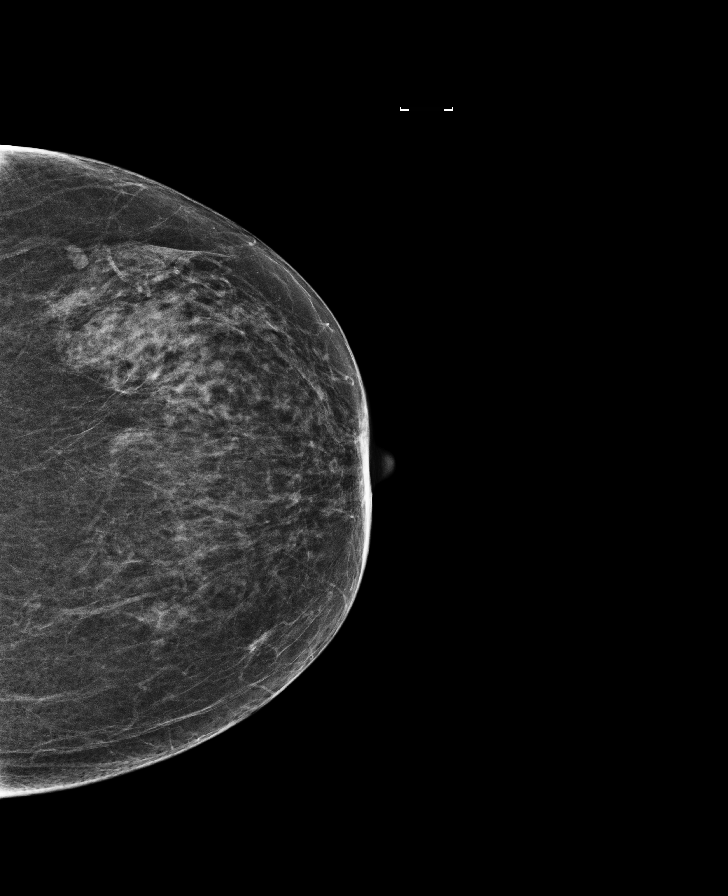

[8 of 28 positions shown; findings below may reference images not displayed]

ACR Breast Density Category b: There are scattered areas of
fibroglandular density.
FINDINGS: There are no findings suspicious for malignancy. Images were
processed with CAD.
IMPRESSION: No mammographic evidence of malignancy. A result letter of this
screening mammogram will be mailed directly to the patient.

RECOMMENDATION:
Screening mammogram in one year. (Code:97-6-RS4)

BI-RADS CATEGORY  1: Negative.

## 2019-06-22 DIAGNOSIS — Z23 Encounter for immunization: Secondary | ICD-10-CM | POA: Diagnosis not present

## 2019-07-12 DIAGNOSIS — R739 Hyperglycemia, unspecified: Secondary | ICD-10-CM | POA: Diagnosis not present

## 2019-07-12 DIAGNOSIS — E559 Vitamin D deficiency, unspecified: Secondary | ICD-10-CM | POA: Diagnosis not present

## 2019-07-12 DIAGNOSIS — M722 Plantar fascial fibromatosis: Secondary | ICD-10-CM | POA: Diagnosis not present

## 2019-08-23 ENCOUNTER — Other Ambulatory Visit: Payer: Self-pay

## 2019-08-23 DIAGNOSIS — Z20822 Contact with and (suspected) exposure to covid-19: Secondary | ICD-10-CM

## 2019-08-24 LAB — NOVEL CORONAVIRUS, NAA: SARS-CoV-2, NAA: NOT DETECTED

## 2019-10-15 DIAGNOSIS — Z1389 Encounter for screening for other disorder: Secondary | ICD-10-CM | POA: Diagnosis not present

## 2019-10-15 DIAGNOSIS — M1990 Unspecified osteoarthritis, unspecified site: Secondary | ICD-10-CM | POA: Diagnosis not present

## 2019-10-15 DIAGNOSIS — Z0000 Encounter for general adult medical examination without abnormal findings: Secondary | ICD-10-CM | POA: Diagnosis not present

## 2019-10-15 DIAGNOSIS — E7800 Pure hypercholesterolemia, unspecified: Secondary | ICD-10-CM | POA: Diagnosis not present

## 2019-10-15 DIAGNOSIS — M722 Plantar fascial fibromatosis: Secondary | ICD-10-CM | POA: Diagnosis not present

## 2019-10-15 DIAGNOSIS — G4733 Obstructive sleep apnea (adult) (pediatric): Secondary | ICD-10-CM | POA: Diagnosis not present

## 2019-10-15 DIAGNOSIS — M810 Age-related osteoporosis without current pathological fracture: Secondary | ICD-10-CM | POA: Diagnosis not present

## 2019-10-15 DIAGNOSIS — F39 Unspecified mood [affective] disorder: Secondary | ICD-10-CM | POA: Diagnosis not present

## 2019-10-15 DIAGNOSIS — E559 Vitamin D deficiency, unspecified: Secondary | ICD-10-CM | POA: Diagnosis not present

## 2019-10-17 ENCOUNTER — Ambulatory Visit: Payer: Medicare Other

## 2019-10-25 ENCOUNTER — Ambulatory Visit: Payer: Medicare Other | Attending: Internal Medicine

## 2019-10-25 DIAGNOSIS — Z23 Encounter for immunization: Secondary | ICD-10-CM | POA: Insufficient documentation

## 2019-10-25 NOTE — Progress Notes (Signed)
   Covid-19 Vaccination Clinic  Name:  [name]    MRN: [id number] DOB: 1951/07/11  10/25/2019  [name] was observed post Covid-19 immunization for 15 minutes without incidence. She was provided with Vaccine Information Sheet and instruction to access the V-Safe system.   [name] was instructed to call 911 with any severe reactions post vaccine: Marland Kitchen Difficulty breathing  . Swelling of your face and throat  . A fast heartbeat  . A bad rash all over your body  . Dizziness and weakness    Immunizations Administered    Name Date Dose VIS Date Route   Pfizer COVID-19 Vaccine 10/25/2019  5:15 PM 0.3 mL 08/30/2019 Intramuscular   Manufacturer: [employer], [employer]   Lot: [id number]   NDC: [address]-1

## 2019-11-03 ENCOUNTER — Ambulatory Visit: Payer: Medicare Other

## 2019-11-19 ENCOUNTER — Ambulatory Visit: Payer: Medicare Other | Attending: Internal Medicine

## 2019-11-19 DIAGNOSIS — Z23 Encounter for immunization: Secondary | ICD-10-CM | POA: Insufficient documentation

## 2019-11-19 NOTE — Progress Notes (Signed)
   Covid-19 Vaccination Clinic  Name:  [name]    MRN: [id number] DOB: Jan 01, 1951  11/19/2019  [name] was observed post Covid-19 immunization for 15 minutes without incident. She was provided with Vaccine Information Sheet and instruction to access the V-Safe system.   [name] was instructed to call 911 with any severe reactions post vaccine: Marland Kitchen Difficulty breathing  . Swelling of face and throat  . A fast heartbeat  . A bad rash all over body  . Dizziness and weakness   Immunizations Administered    Name Date Dose VIS Date Route   Pfizer COVID-19 Vaccine 11/19/2019  4:00 PM 0.3 mL 08/30/2019 Intramuscular   Manufacturer: [employer], [employer]   Lot: [id number]   NDC: [address]-2

## 2019-12-13 ENCOUNTER — Other Ambulatory Visit: Payer: Self-pay | Admitting: Obstetrics & Gynecology

## 2019-12-13 DIAGNOSIS — N950 Postmenopausal bleeding: Secondary | ICD-10-CM | POA: Diagnosis not present

## 2019-12-13 DIAGNOSIS — N858 Other specified noninflammatory disorders of uterus: Secondary | ICD-10-CM | POA: Diagnosis not present

## 2019-12-31 DIAGNOSIS — S92354A Nondisplaced fracture of fifth metatarsal bone, right foot, initial encounter for closed fracture: Secondary | ICD-10-CM | POA: Diagnosis not present

## 2020-01-28 DIAGNOSIS — S92354D Nondisplaced fracture of fifth metatarsal bone, right foot, subsequent encounter for fracture with routine healing: Secondary | ICD-10-CM | POA: Diagnosis not present

## 2020-04-27 DIAGNOSIS — E559 Vitamin D deficiency, unspecified: Secondary | ICD-10-CM | POA: Diagnosis not present

## 2020-06-13 DIAGNOSIS — Z23 Encounter for immunization: Secondary | ICD-10-CM | POA: Diagnosis not present

## 2020-07-04 ENCOUNTER — Ambulatory Visit: Payer: Medicare Other | Attending: Internal Medicine

## 2020-07-04 DIAGNOSIS — Z23 Encounter for immunization: Secondary | ICD-10-CM

## 2020-07-04 NOTE — Progress Notes (Signed)
   Covid-19 Vaccination Clinic  Name:  TIMBERLYN PICKFORD    MRN: [id number] DOB: Nov 21, 1950  07/04/2020  Ms. Buffalo was observed post Covid-19 immunization for 15 minutes without incident. She was provided with Vaccine Information Sheet and instruction to access the V-Safe system.   Ms. Vinal was instructed to call 911 with any severe reactions post vaccine: Marland Kitchen Difficulty breathing  . Swelling of face and throat  . A fast heartbeat  . A bad rash all over body  . Dizziness and weakness

## 2020-10-15 DIAGNOSIS — M25572 Pain in left ankle and joints of left foot: Secondary | ICD-10-CM | POA: Diagnosis not present

## 2020-10-20 ENCOUNTER — Other Ambulatory Visit: Payer: Self-pay | Admitting: Family Medicine

## 2020-10-20 DIAGNOSIS — M810 Age-related osteoporosis without current pathological fracture: Secondary | ICD-10-CM | POA: Diagnosis not present

## 2020-10-20 DIAGNOSIS — M1990 Unspecified osteoarthritis, unspecified site: Secondary | ICD-10-CM | POA: Diagnosis not present

## 2020-10-20 DIAGNOSIS — Z1389 Encounter for screening for other disorder: Secondary | ICD-10-CM | POA: Diagnosis not present

## 2020-10-20 DIAGNOSIS — G4733 Obstructive sleep apnea (adult) (pediatric): Secondary | ICD-10-CM | POA: Diagnosis not present

## 2020-10-20 DIAGNOSIS — F39 Unspecified mood [affective] disorder: Secondary | ICD-10-CM | POA: Diagnosis not present

## 2020-10-20 DIAGNOSIS — Z0000 Encounter for general adult medical examination without abnormal findings: Secondary | ICD-10-CM | POA: Diagnosis not present

## 2020-10-20 DIAGNOSIS — E559 Vitamin D deficiency, unspecified: Secondary | ICD-10-CM | POA: Diagnosis not present

## 2020-10-20 DIAGNOSIS — Z1231 Encounter for screening mammogram for malignant neoplasm of breast: Secondary | ICD-10-CM

## 2020-10-20 DIAGNOSIS — E7800 Pure hypercholesterolemia, unspecified: Secondary | ICD-10-CM | POA: Diagnosis not present

## 2020-10-20 DIAGNOSIS — K5720 Diverticulitis of large intestine with perforation and abscess without bleeding: Secondary | ICD-10-CM | POA: Diagnosis not present

## 2020-10-21 ENCOUNTER — Other Ambulatory Visit: Payer: Self-pay | Admitting: Family Medicine

## 2020-10-21 DIAGNOSIS — M810 Age-related osteoporosis without current pathological fracture: Secondary | ICD-10-CM

## 2020-10-21 DIAGNOSIS — M8580 Other specified disorders of bone density and structure, unspecified site: Secondary | ICD-10-CM

## 2021-03-05 DIAGNOSIS — E7800 Pure hypercholesterolemia, unspecified: Secondary | ICD-10-CM | POA: Diagnosis not present

## 2021-03-09 ENCOUNTER — Ambulatory Visit
Admission: RE | Admit: 2021-03-09 | Discharge: 2021-03-09 | Disposition: A | Discharge status: Home / Self Care | Payer: Medicare Other | Source: Ambulatory Visit | Attending: Family Medicine | Admitting: Family Medicine

## 2021-03-09 ENCOUNTER — Other Ambulatory Visit: Payer: Self-pay

## 2021-03-09 DIAGNOSIS — M810 Age-related osteoporosis without current pathological fracture: Secondary | ICD-10-CM

## 2021-03-09 DIAGNOSIS — Z780 Asymptomatic menopausal state: Secondary | ICD-10-CM | POA: Diagnosis not present

## 2021-03-09 DIAGNOSIS — M85851 Other specified disorders of bone density and structure, right thigh: Secondary | ICD-10-CM | POA: Diagnosis not present

## 2021-03-09 DIAGNOSIS — Z1231 Encounter for screening mammogram for malignant neoplasm of breast: Secondary | ICD-10-CM | POA: Diagnosis not present

## 2021-03-09 DIAGNOSIS — M8580 Other specified disorders of bone density and structure, unspecified site: Secondary | ICD-10-CM

## 2021-04-20 DIAGNOSIS — M79642 Pain in left hand: Secondary | ICD-10-CM | POA: Diagnosis not present

## 2021-06-25 DIAGNOSIS — Z23 Encounter for immunization: Secondary | ICD-10-CM | POA: Diagnosis not present

## 2021-10-05 DIAGNOSIS — U071 COVID-19: Secondary | ICD-10-CM | POA: Diagnosis not present

## 2021-11-02 DIAGNOSIS — N950 Postmenopausal bleeding: Secondary | ICD-10-CM | POA: Diagnosis not present

## 2021-11-02 DIAGNOSIS — E7800 Pure hypercholesterolemia, unspecified: Secondary | ICD-10-CM | POA: Diagnosis not present

## 2021-11-02 DIAGNOSIS — R311 Benign essential microscopic hematuria: Secondary | ICD-10-CM | POA: Diagnosis not present

## 2021-11-02 DIAGNOSIS — F39 Unspecified mood [affective] disorder: Secondary | ICD-10-CM | POA: Diagnosis not present

## 2021-11-02 DIAGNOSIS — E559 Vitamin D deficiency, unspecified: Secondary | ICD-10-CM | POA: Diagnosis not present

## 2021-11-02 DIAGNOSIS — M1990 Unspecified osteoarthritis, unspecified site: Secondary | ICD-10-CM | POA: Diagnosis not present

## 2021-11-02 DIAGNOSIS — G4733 Obstructive sleep apnea (adult) (pediatric): Secondary | ICD-10-CM | POA: Diagnosis not present

## 2021-11-02 DIAGNOSIS — Z23 Encounter for immunization: Secondary | ICD-10-CM | POA: Diagnosis not present

## 2021-11-02 DIAGNOSIS — Z0000 Encounter for general adult medical examination without abnormal findings: Secondary | ICD-10-CM | POA: Diagnosis not present

## 2021-11-02 DIAGNOSIS — M810 Age-related osteoporosis without current pathological fracture: Secondary | ICD-10-CM | POA: Diagnosis not present

## 2021-11-02 DIAGNOSIS — Z1389 Encounter for screening for other disorder: Secondary | ICD-10-CM | POA: Diagnosis not present

## 2022-05-06 DIAGNOSIS — E559 Vitamin D deficiency, unspecified: Secondary | ICD-10-CM | POA: Diagnosis not present

## 2022-06-18 DIAGNOSIS — Z23 Encounter for immunization: Secondary | ICD-10-CM | POA: Diagnosis not present

## 2022-08-01 DIAGNOSIS — Z23 Encounter for immunization: Secondary | ICD-10-CM | POA: Diagnosis not present

## 2022-11-08 DIAGNOSIS — E669 Obesity, unspecified: Secondary | ICD-10-CM | POA: Diagnosis not present

## 2022-11-08 DIAGNOSIS — M810 Age-related osteoporosis without current pathological fracture: Secondary | ICD-10-CM | POA: Diagnosis not present

## 2022-11-08 DIAGNOSIS — F39 Unspecified mood [affective] disorder: Secondary | ICD-10-CM | POA: Diagnosis not present

## 2022-11-08 DIAGNOSIS — M1990 Unspecified osteoarthritis, unspecified site: Secondary | ICD-10-CM | POA: Diagnosis not present

## 2022-11-08 DIAGNOSIS — E7800 Pure hypercholesterolemia, unspecified: Secondary | ICD-10-CM | POA: Diagnosis not present

## 2022-11-08 DIAGNOSIS — Z0000 Encounter for general adult medical examination without abnormal findings: Secondary | ICD-10-CM | POA: Diagnosis not present

## 2022-11-08 DIAGNOSIS — M19049 Primary osteoarthritis, unspecified hand: Secondary | ICD-10-CM | POA: Diagnosis not present

## 2022-11-08 DIAGNOSIS — I459 Conduction disorder, unspecified: Secondary | ICD-10-CM | POA: Diagnosis not present

## 2022-11-08 DIAGNOSIS — E559 Vitamin D deficiency, unspecified: Secondary | ICD-10-CM | POA: Diagnosis not present

## 2022-11-08 DIAGNOSIS — G4733 Obstructive sleep apnea (adult) (pediatric): Secondary | ICD-10-CM | POA: Diagnosis not present

## 2022-11-08 DIAGNOSIS — F5101 Primary insomnia: Secondary | ICD-10-CM | POA: Diagnosis not present

## 2023-01-05 DIAGNOSIS — F39 Unspecified mood [affective] disorder: Secondary | ICD-10-CM | POA: Diagnosis not present

## 2023-01-05 DIAGNOSIS — F5101 Primary insomnia: Secondary | ICD-10-CM | POA: Diagnosis not present

## 2023-03-06 DIAGNOSIS — E7800 Pure hypercholesterolemia, unspecified: Secondary | ICD-10-CM | POA: Diagnosis not present

## 2023-03-06 DIAGNOSIS — R946 Abnormal results of thyroid function studies: Secondary | ICD-10-CM | POA: Diagnosis not present

## 2023-06-03 DIAGNOSIS — Z23 Encounter for immunization: Secondary | ICD-10-CM | POA: Diagnosis not present

## 2023-07-11 DIAGNOSIS — Z23 Encounter for immunization: Secondary | ICD-10-CM | POA: Diagnosis not present

## 2023-09-19 ENCOUNTER — Other Ambulatory Visit: Payer: Self-pay

## 2023-09-19 ENCOUNTER — Emergency Department (HOSPITAL_COMMUNITY)
Admission: EM | Admit: 2023-09-19 | Discharge: 2023-09-20 | Discharge status: Against Medical Advice | Payer: Medicare Other | Attending: Emergency Medicine | Admitting: Emergency Medicine

## 2023-09-19 ENCOUNTER — Emergency Department (HOSPITAL_COMMUNITY): Payer: Medicare Other

## 2023-09-19 ENCOUNTER — Encounter (HOSPITAL_COMMUNITY): Payer: Self-pay | Admitting: Emergency Medicine

## 2023-09-19 DIAGNOSIS — R519 Headache, unspecified: Secondary | ICD-10-CM | POA: Insufficient documentation

## 2023-09-19 DIAGNOSIS — S0101XA Laceration without foreign body of scalp, initial encounter: Secondary | ICD-10-CM | POA: Diagnosis not present

## 2023-09-19 DIAGNOSIS — Z5321 Procedure and treatment not carried out due to patient leaving prior to being seen by health care provider: Secondary | ICD-10-CM | POA: Insufficient documentation

## 2023-09-19 DIAGNOSIS — W01198A Fall on same level from slipping, tripping and stumbling with subsequent striking against other object, initial encounter: Secondary | ICD-10-CM | POA: Diagnosis not present

## 2023-09-19 DIAGNOSIS — S0990XA Unspecified injury of head, initial encounter: Secondary | ICD-10-CM | POA: Diagnosis not present

## 2023-09-19 MED ORDER — TETANUS-DIPHTH-ACELL PERTUSSIS 5-2.5-18.5 LF-MCG/0.5 IM SUSY: 0.5000 mL | PREFILLED_SYRINGE | Freq: Once | INTRAMUSCULAR | Status: DC

## 2023-09-19 MED ORDER — ACETAMINOPHEN 500 MG PO TABS: 1000.0000 mg | ORAL_TABLET | Freq: Once | ORAL | Status: DC

## 2023-09-19 NOTE — ED Triage Notes (Signed)
Pt presents with head pain after mechanical fall about an hour ago. No thinners.  Tripped next to bed and hit head.  No LOC. No dizziness or CP

## 2023-09-19 NOTE — ED Provider Triage Note (Signed)
 Emergency Medicine Provider Triage Evaluation Note  Arland EMERSON CANDIE Netta , a 72 y.o. female  was evaluated in triage.  Pt complains of scalp pain after mechanical fall an hour ago. Was at bedside and tripped, hit head. No LOC. No blood thinners. No dizziness, chest pain, or SOB prior to fall  Review of Systems  Positive: Head injury Negative: N/V  Physical Exam  BP 105/67 (BP Location: Left Arm)   Pulse 91   Temp 98.3 F (36.8 C)   Resp 16   SpO2 96%  Gen:   Awake, no distress   Resp:  Normal effort  MSK:   Moves extremities without difficulty  Other:  +wound to scalp  Medical Decision Making  Medically screening exam initiated at 9:31 PM.  Appropriate orders placed.  Arland EMERSON CANDIE Netta was informed that the remainder of the evaluation will be completed by another provider, this initial triage assessment does not replace that evaluation, and the importance of remaining in the ED until their evaluation is complete.     Philippa Lyle CROME, [place] 09/19/23 2131

## 2023-09-20 ENCOUNTER — Emergency Department (HOSPITAL_BASED_OUTPATIENT_CLINIC_OR_DEPARTMENT_OTHER)
Admission: EM | Admit: 2023-09-20 | Discharge: 2023-09-20 | Disposition: A | Discharge status: Home / Self Care | Payer: Medicare Other | Source: Home / Self Care | Attending: Emergency Medicine | Admitting: Emergency Medicine

## 2023-09-20 ENCOUNTER — Ambulatory Visit
Admission: EM | Admit: 2023-09-20 | Discharge: 2023-09-20 | Disposition: A | Discharge status: Other Acute Inpatient Hospital | Payer: Medicare Other

## 2023-09-20 ENCOUNTER — Encounter: Payer: Self-pay | Admitting: Emergency Medicine

## 2023-09-20 ENCOUNTER — Other Ambulatory Visit: Payer: Self-pay

## 2023-09-20 ENCOUNTER — Encounter (HOSPITAL_BASED_OUTPATIENT_CLINIC_OR_DEPARTMENT_OTHER): Payer: Self-pay

## 2023-09-20 DIAGNOSIS — Z5321 Procedure and treatment not carried out due to patient leaving prior to being seen by health care provider: Secondary | ICD-10-CM | POA: Diagnosis not present

## 2023-09-20 DIAGNOSIS — R9431 Abnormal electrocardiogram [ECG] [EKG]: Secondary | ICD-10-CM | POA: Diagnosis not present

## 2023-09-20 DIAGNOSIS — W01198A Fall on same level from slipping, tripping and stumbling with subsequent striking against other object, initial encounter: Secondary | ICD-10-CM | POA: Insufficient documentation

## 2023-09-20 DIAGNOSIS — S0990XA Unspecified injury of head, initial encounter: Secondary | ICD-10-CM | POA: Diagnosis present

## 2023-09-20 DIAGNOSIS — R519 Headache, unspecified: Secondary | ICD-10-CM | POA: Diagnosis not present

## 2023-09-20 DIAGNOSIS — S0101XA Laceration without foreign body of scalp, initial encounter: Secondary | ICD-10-CM

## 2023-09-20 DIAGNOSIS — W19XXXA Unspecified fall, initial encounter: Secondary | ICD-10-CM

## 2023-09-20 LAB — BASIC METABOLIC PANEL
Anion gap: 7 (ref 5–15)
BUN: 15 mg/dL (ref 8–23)
CO2: 29 mmol/L (ref 22–32)
Calcium: 8.9 mg/dL (ref 8.9–10.3)
Chloride: 98 mmol/L (ref 98–111)
Creatinine, Ser: 0.68 mg/dL (ref 0.44–1.00)
GFR, Estimated: >60 mL/min (ref >60)
Glucose, Bld: 96 mg/dL (ref 70–99)
Potassium: 4.4 mmol/L (ref 3.5–5.1)
Sodium: 134 mmol/L — ABNORMAL LOW (ref 135–145)

## 2023-09-20 LAB — CBC
HCT: 37.9 % (ref 36.0–46.0)
Hemoglobin: 12.7 g/dL (ref 12.0–15.0)
MCH: 31.1 pg (ref 26.0–34.0)
MCHC: 33.5 g/dL (ref 30.0–36.0)
MCV: 92.9 fL (ref 80.0–100.0)
Platelets: 192 10*3/uL (ref 150–400)
RBC: 4.08 MIL/uL (ref 3.87–5.11)
RDW: 13 % (ref 11.5–15.5)
WBC: 6.7 10*3/uL (ref 4.0–10.5)
nRBC: 0 % (ref 0.0–0.2)

## 2023-09-20 LAB — TROPONIN I (HIGH SENSITIVITY): Troponin I (High Sensitivity): 4 ng/L (ref <18)

## 2023-09-20 MED ORDER — CEPHALEXIN 500 MG PO CAPS: 500.0000 mg | ORAL_CAPSULE | Freq: Four times a day (QID) | ORAL | 0 refills | Status: DC

## 2023-09-20 MED ORDER — LIDOCAINE-EPINEPHRINE (PF) 2 %-1:200000 IJ SOLN
INTRAMUSCULAR | Status: AC
  Administered 2023-09-20: 10 mL
  Filled 2023-09-20: qty 20

## 2023-09-20 MED ORDER — LIDOCAINE-EPINEPHRINE (PF) 2 %-1:200000 IJ SOLN: 10.0000 mL | Freq: Once | INTRAMUSCULAR | Status: AC

## 2023-09-20 MED ORDER — LIDOCAINE-EPINEPHRINE 2 %-1:100000 IJ SOLN
10.0000 mL | Freq: Once | INTRAMUSCULAR | Status: DC
  Filled 2023-09-20: qty 10.2

## 2023-09-20 NOTE — ED Notes (Signed)
 ED Provider at bedside.

## 2023-09-20 NOTE — ED Triage Notes (Signed)
 Fell last night hitting head.  Does not remember fall.  Found on floor by husband.  Was responsive at that time.  Possible +LOC.  Ambulatory to room without difficulty.  States went to [facility] ER.  Had CT.  States was not seen due to wait.  Laceration about 3-4 inches long from above left eye to midline hairline.

## 2023-09-20 NOTE — ED Notes (Signed)
 Patient is being discharged from the Urgent Care and sent to the Emergency Department via POV . Per RM, patient is in need of higher level of care due to large scalp laceration. Patient is aware and verbalizes understanding of plan of care.  Vitals:   09/20/23 1001  BP: (!) 162/74  Pulse: (!) 57  Resp: 18  Temp: 99 F (37.2 C)  SpO2: 95%

## 2023-09-20 NOTE — ED Notes (Signed)
 Pt left. No longer wanted to wait, advised if symptoms get worse to come back or call 911.

## 2023-09-20 NOTE — Discharge Instructions (Signed)
 The sutures and staples can come out in about 7 days.

## 2023-09-20 NOTE — ED Provider Notes (Signed)
**[name]**  Rose [name]   CSN: [id number] Arrival date & time: 09/20/23  1021     History  Chief Complaint  Patient presents with   Laceration   Head Injury    Rose [name] Netta is a 73 y.o. female.   Laceration Head Injury Patient presents after fall.  Fell last night.  Does not know what happened but thinks she hit her head on the table.  No chest pain.  Went to [facility] yesterday but did not wait for results.  Had head CT scan is reassuring.  Patient does not really remember exactly what happened.  Has been doing well today.  Was a little confused after the event.  Tetanus is up-to-date.  Does have laceration to scalp.    Past Medical History:  Diagnosis Date   Depression    Diverticulitis of colon with perforation    DUB (dysfunctional uterine bleeding)    MENOPAUSAL   Elevated blood pressure reading 11/14/2016   Family history of abdominal aortic aneurysm (AAA) 11/14/2016   History of migraine headaches     Home Medications Prior to Admission medications   Medication Sig Start Date End Date Taking? Authorizing Provider  cephALEXin  (KEFLEX ) 500 MG capsule Take 1 capsule (500 mg total) by mouth 4 (four) times daily. 09/20/23  Yes Patsey Lot, MD  acetaminophen  (TYLENOL ) 325 MG tablet Take 650 mg by mouth every 6 (six) hours as needed.    [provider]  ALPRAZolam (XANAX) 0.5 MG tablet Take 0.5 mg by mouth at bedtime as needed for anxiety.    [provider]  buPROPion (WELLBUTRIN XL) 300 MG 24 hr tablet Take 300 mg by mouth daily.    [provider]  Calcium Carbonate (CALCIUM 600 PO) Take 1 tablet by mouth 2 (two) times daily.    [provider]  traZODone (DESYREL) 100 MG tablet Take 100 mg by mouth at bedtime.    [provider]  vitamin E 400 UNIT capsule Take 400 Units by mouth daily.    [provider]      Allergies    Codeine, Lexapro  [escitalopram], and Vitamin d analogs    Review of Systems   Review of Systems  Physical Exam Updated Vital Signs BP (!) 149/82 (BP Location: Right Arm)   Pulse 91   Temp 98.2 F (36.8 C) (Oral)   Resp 16   Ht 5' 2 (1.575 m)   Wt 79.8 kg   SpO2 97%   BMI 32.18 kg/m  Physical Exam Vitals and nursing [name] reviewed.  HENT:     Head:     Comments: Laceration on left forehead into hairline approximately 9 cm long.  Also later found posterior to this another 4 cm laceration. Chest:     Chest wall: No tenderness.  Abdominal:     Tenderness: There is no abdominal tenderness.  Musculoskeletal:        General: No tenderness.     Cervical back: Neck supple. No tenderness.  Skin:    Capillary Refill: Capillary refill takes less than 2 seconds.  Neurological:     Mental Status: She is alert and oriented to person, place, and time.     ED Results / Procedures / Treatments   Labs (all labs ordered are listed, but only abnormal results are displayed) Labs Reviewed  BASIC METABOLIC PANEL - Abnormal; Notable for the following components:      Result Value  Sodium 134 (*)    All other components within normal limits  CBC  TROPONIN I (HIGH SENSITIVITY)  TROPONIN I (HIGH SENSITIVITY)    EKG None  Radiology CT Head Wo Contrast Result Date: 09/19/2023 CLINICAL DATA:  Head trauma EXAM: CT HEAD WITHOUT CONTRAST TECHNIQUE: Contiguous axial images were obtained from the base of the skull through the vertex without intravenous contrast. RADIATION DOSE REDUCTION: This exam was performed according to the departmental dose-optimization program which includes automated exposure control, adjustment of the mA and/or kV according to patient size and/or use of iterative reconstruction technique. COMPARISON:  None Available. FINDINGS: Brain: No mass,hemorrhage or extra-axial collection. Normal appearance of the parenchyma and CSF spaces. Vascular: No hyperdense vessel or unexpected vascular  calcification. Skull: Left parietal scalp hematoma. No skull fracture. Left frontal scalp laceration. Sinuses/Orbits: No fluid levels or advanced mucosal thickening of the visualized paranasal sinuses. No mastoid or middle ear effusion. Normal orbits. Other: None. IMPRESSION: 1. No acute intracranial abnormality. 2. Left parietal scalp hematoma and left frontal scalp laceration. No skull fracture. Electronically Signed   By: Franky Stanford M.D.   On: 09/19/2023 23:00    Procedures .Laceration Repair  Date/Time: 09/20/2023 2:07 PM  Performed by: Patsey Lot, MD Authorized by: Patsey Lot, MD   Consent:    Consent obtained:  Verbal   Consent given by:  Patient   Risks, benefits, and alternatives were discussed: yes     Risks discussed:  Infection, pain, need for additional repair, retained foreign body, nerve damage, poor cosmetic result and poor wound healing   Alternatives discussed:  No treatment Laceration details:    Location:  Scalp   Scalp location:  Frontal   Length (cm):  9 Pre-procedure details:    Preparation:  Patient was prepped and draped in usual sterile fashion and imaging obtained to evaluate for foreign bodies Exploration:    Limited defect created (wound extended): no     Hemostasis achieved with:  Epinephrine  Treatment:    Area cleansed with:  Saline   Amount of cleaning:  Standard   Irrigation solution:  Sterile saline   Irrigation volume:  1L   Visualized foreign bodies/material removed: yes   Skin repair:    Repair method:  Sutures   Suture size:  4-0   Suture material:  Prolene   Suture technique:  Simple interrupted and running   Number of sutures: 11 running sutures and 5 simple interrupted. Approximation:    Approximation:  Close Repair type:    Repair type:  Simple Post-procedure details:    Dressing:  Sterile dressing   Procedure completion:  Tolerated .Laceration Repair  Date/Time: 09/20/2023 2:09 PM  Performed by: Patsey Lot,  MD Authorized by: Patsey Lot, MD   Consent:    Consent obtained:  Verbal   Risks, benefits, and alternatives were discussed: yes     Risks discussed:  Infection, retained foreign body, pain, poor cosmetic result, tendon damage, poor wound healing, nerve damage and need for additional repair   Alternatives discussed:  No treatment, delayed treatment and observation Laceration details:    Location:  Scalp   Length (cm):  4 Pre-procedure details:    Preparation:  Patient was prepped and draped in usual sterile fashion Exploration:    Limited defect created (wound extended): no     Hemostasis achieved with:  Epinephrine    Imaging outcome: foreign body not noted     Wound exploration: wound explored through full range of motion  Contaminated: no   Treatment:    Area cleansed with:  Saline   Amount of cleaning:  Standard   Irrigation solution:  Sterile saline   Irrigation volume:  1L   Visualized foreign bodies/material removed: no   Skin repair:    Repair method:  Staples   Number of staples:  6 Approximation:    Approximation:  Close Repair type:    Repair type:  Simple Post-procedure details:    Dressing:  Sterile dressing   Procedure completion:  Tolerated well, no immediate complications     Medications Ordered in ED Medications  lidocaine -EPINEPHrine  (XYLOCAINE  W/EPI) 2 %-1:200000 (PF) injection 10 mL (10 mLs Infiltration Given by Other 09/20/23 1230)    ED Course/ Medical Decision Making/ A&P                                 Medical Decision Making Amount and/or Complexity of Data Reviewed Labs: ordered.  Risk Prescription drug management.   Patient with fall.  Unknown cause.  EKG reassuring.  Troponin negative.  Head CT done yesterday and reassuring.  Patient is back at baseline.  Had lacerations closed.  Discharge home.  Follow-up for removal of sutures in approximately a week due to the diastases of the wound on the forehead.  Will also give some  prophylactic antibiotics since wound has been open for 18 hours.        Final Clinical Impression(s) / ED Diagnoses Final diagnoses:  Fall, initial encounter  Laceration of scalp, initial encounter    Rx / DC Orders ED Discharge Orders          Ordered    cephALEXin  (KEFLEX ) 500 MG capsule  4 times daily        09/20/23 1407              Patsey Lot, MD 09/20/23 1410

## 2023-09-20 NOTE — ED Notes (Signed)
 Wound irrigated w/1 liter bolus, patient tolerated well. Re-bandaged with bulky gauze, no additional bleeding noted at this time.

## 2023-09-20 NOTE — ED Triage Notes (Signed)
 Pt here after having fall last night hitting head on night stand; pt does not remember fall was seen in ED but left; large laceration noted to left side of forehead

## 2023-09-20 NOTE — ED Triage Notes (Signed)
 Patient A&O x 4

## 2023-09-20 NOTE — ED Provider Notes (Signed)
 Patient presented to urgent care for evaluation of head injury/ laceration that occurred last night. She went to  ED but waited until 2 AM and had to leave. CT was negative for intracranial hemorrhage. Recommended further evaluation in the ED given significance of laceration/ duration since injury- patient's husband to transport via POV.    Billy Asberry FALCON, PA-C 09/20/23 1004

## 2023-09-25 ENCOUNTER — Other Ambulatory Visit: Payer: Self-pay | Admitting: Internal Medicine

## 2023-09-25 DIAGNOSIS — Z4802 Encounter for removal of sutures: Secondary | ICD-10-CM | POA: Diagnosis not present

## 2023-09-25 DIAGNOSIS — W19XXXD Unspecified fall, subsequent encounter: Secondary | ICD-10-CM

## 2023-09-25 DIAGNOSIS — R413 Other amnesia: Secondary | ICD-10-CM

## 2023-09-25 DIAGNOSIS — F5101 Primary insomnia: Secondary | ICD-10-CM | POA: Diagnosis not present

## 2023-09-25 DIAGNOSIS — S0101XD Laceration without foreign body of scalp, subsequent encounter: Secondary | ICD-10-CM | POA: Diagnosis not present

## 2023-09-26 ENCOUNTER — Encounter: Payer: Self-pay | Admitting: Physician Assistant

## 2023-10-02 DIAGNOSIS — F5101 Primary insomnia: Secondary | ICD-10-CM | POA: Diagnosis not present

## 2023-10-02 DIAGNOSIS — R030 Elevated blood-pressure reading, without diagnosis of hypertension: Secondary | ICD-10-CM | POA: Diagnosis not present

## 2023-10-02 DIAGNOSIS — Z4889 Encounter for other specified surgical aftercare: Secondary | ICD-10-CM | POA: Diagnosis not present

## 2023-10-02 DIAGNOSIS — R413 Other amnesia: Secondary | ICD-10-CM | POA: Diagnosis not present

## 2023-10-07 NOTE — Progress Notes (Signed)
Assessment/Plan:     [name] is a very pleasant 73 y.o. year old RH female with a history of hypertension, hyperlipidemia, insomnia, osteoporosis, depression, anxiety, seen today for evaluation of memory loss. MoCA today is 22/30. Workup is in progress. 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 Patient is able to participate on his ADLs and to drive without difficulties  Discussed initiating ACHI in an effort to slow down memory loss, she agrees to proceed.     Memory Impairment of unclear etiology   MRI brain without contrast to assess for underlying structural abnormality and assess vascular load has been ordered by PCP, will follow the results    Neurocognitive testing to further evaluate cognitive concerns and determine other underlying cause of memory changes, including potential contribution from sleep, anxiety, attention, or depression among others  Start Donepezil 5mg  daily. Side effects discussed   Check B12, B1, TSH Recommend good control of cardiovascular risk factors. Patient aware of BP measurements   Continue to control mood as per PCP Folllow up in 3 months   Subjective:    The patient is here alone who supplements the history.    How long did patient have memory difficulties?  For about 2-3 years  Patient reports some difficulty remembering new information, recent conversations, names. She attributes it to anesthesia side effects "I had a lot of surgeries".  LTM is "excellent". repeats oneself?  Endorsed, not frequently  Disoriented when walking into a room?  Patient denies. Leaving objects in unusual places? Denies   Wandering behavior? Denies.   Any personality changes, or depression, anxiety? Denies.  Hallucinations or paranoia? Denies.   Seizures? denies .   Any sleep changes?  Sleeps well, takes trazodone.  Sporadically she reports vivid dreams, REM behavior or sleepwalking   Sleep apnea? Denies.   Any hygiene concerns?   Denies.   Independent of bathing and dressing? Endorsed  Does the patient need help with medications? Patient is in charge   Who is in charge of the finances? Husband and her are in charge     Any changes in appetite?   Denies.     Patient have trouble swallowing?  Denies.   Does the patient cook? No  Any headaches?  Denies.   Chronic pain? Denies.   Ambulates with difficulty? Denies. She reports being very active.  Recent falls or head injuries?  Endorsed, on NYE, was sitting at the firepit and was ready to go to  sit on the on bed and I don't have any recall, she fell and hit the marble night table with head injury and presented to the ED at [facility], waiting for several hours without being seen, thus, she left.  In the process, she had a CT scan which was negative for acute findings but she has no recall of the incident.  She did have a laceration in his scalp, to the following day she went to [facility] ED for repair of it.  Of note, cardiac workup was negative. Vision changes?  Denies any new issues.    Any strokelike symptoms? Denies.   Any tremors? Denies.   Any anosmia? Denies.   Any incontinence of urine? Denies.   Any bowel dysfunction? Denies.      Patient lives with her husband and oldest son    History of heavy alcohol intake? Drinks 1-2 glasses of wine a night   History of heavy tobacco use? Denies.   Family  history of dementia?  Negative for dementia  Does patient drive? Yes, denies any issues. 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[employer], retired 7 yrs ago   Allergies  Allergen Reactions   Codeine Itching   Lexapro [Escitalopram]     FEELS WEIRD   Vitamin D Analogs     Current Outpatient Medications  Medication Instructions   acetaminophen (TYLENOL) 650 mg, Oral, Every 6 hours PRN   ALPRAZolam (XANAX) 0.5 mg, At bedtime PRN   buPROPion (WELLBUTRIN XL) 300 mg, Oral, Daily   Calcium Carbonate (CALCIUM 600 PO) 1 tablet, 2 times daily   sertraline  (ZOLOFT) 100 MG tablet TAKE 1 TABLET BY MOUTH DAILY for 90   traZODone (DESYREL) 100 mg, Daily at bedtime   vitamin E 400 Units, Daily     VITALS:   Vitals:   10/09/23 0800  BP: (!) 149/85  Pulse: 81  SpO2: 97%  Weight: 160 lb (72.6 kg)  Height: 5' 2.5" (1.588 m)      PHYSICAL EXAM   HEENT:  Normocephalic, atraumatic.  The superficial temporal arteries are without ropiness or tenderness. Cardiovascular: Regular rate and rhythm. Lungs: Clear to auscultation bilaterally. Neck: There are no carotid bruits noted bilaterally.  NEUROLOGICAL:    10/09/2023    8:00 AM  Montreal Cognitive Assessment   Visuospatial/ Executive (0/5) 4  Naming (0/3) 3  Attention: Read list of digits (0/2) 2  Attention: Read list of letters (0/1) 1  Attention: Serial 7 subtraction starting at 100 (0/3) 1  Language: Repeat phrase (0/2) 2  Language : Fluency (0/1) 1  Abstraction (0/2) 2  Delayed Recall (0/5) 0  Orientation (0/6) 5  Total 21  Adjusted Score (based on education) 22        No data to display           Orientation:  Alert and oriented to person, place and not to date . No aphasia or dysarthria. Fund of knowledge is appropriate. Recent memory impaired, remote memory normal.  Attention and concentration are normal .  Able to name objects and repeat phrases .Delayed recall  0/5 Cranial nerves: There is good facial symmetry. Extraocular muscles are intact and visual fields are full to confrontational testing. Speech is fluent and clear. No tongue deviation. Hearing is intact to conversational tone.  Tone: Tone is good throughout. Sensation: Sensation is intact to light touch.  Vibration is intact at the bilateral big toe.  Coordination: The patient has no difficulty with RAM's or FNF bilaterally. Normal finger to nose  Motor: Strength is 5/5 in the bilateral upper and lower extremities. There is no pronator drift. There are no fasciculations noted. DTR's: Deep tendon reflexes are 2/4  bilaterally. Gait and Station: The patient is able to ambulate without difficulty. Gait is cautious and narrow. Stride length is normal        Thank you for allowing [place] the opportunity to participate in the care of this nice patient. Please do not hesitate to contact us for any questions or concerns.   Total time spent on today's visit was 60 minutes dedicated to this patient today, preparing to see patient, examining the patient, ordering tests and/or medications and counseling the patient, documenting clinical information in the EHR or other health record, independently interpreting results and communicating results to the patient/family, discussing treatment and goals, answering patient's questions and coordinating care.  Cc:  Ollen Bowl, MD  Marlowe Kays 10/09/2023 8:51 AM

## 2023-10-09 ENCOUNTER — Other Ambulatory Visit: Payer: Medicare Other

## 2023-10-09 ENCOUNTER — Ambulatory Visit (INDEPENDENT_AMBULATORY_CARE_PROVIDER_SITE_OTHER): Payer: Medicare Other | Admitting: Physician Assistant

## 2023-10-09 ENCOUNTER — Ambulatory Visit: Payer: Medicare Other

## 2023-10-09 ENCOUNTER — Encounter: Payer: Self-pay | Admitting: Physician Assistant

## 2023-10-09 VITALS — BP 149/85 | HR 81 | Ht 62.5 in | Wt 160.0 lb

## 2023-10-09 DIAGNOSIS — R413 Other amnesia: Secondary | ICD-10-CM | POA: Diagnosis not present

## 2023-10-09 DIAGNOSIS — R404 Transient alteration of awareness: Secondary | ICD-10-CM

## 2023-10-09 MED ORDER — DONEPEZIL HCL 5 MG PO TABS: 5.0000 mg | ORAL_TABLET | Freq: Every day | ORAL | 11 refills | Status: AC

## 2023-10-09 NOTE — Patient Instructions (Addendum)

## 2023-10-11 ENCOUNTER — Ambulatory Visit (INDEPENDENT_AMBULATORY_CARE_PROVIDER_SITE_OTHER): Payer: Medicare Other | Admitting: Neurology

## 2023-10-11 ENCOUNTER — Other Ambulatory Visit: Payer: Medicare Other

## 2023-10-11 DIAGNOSIS — R413 Other amnesia: Secondary | ICD-10-CM | POA: Diagnosis not present

## 2023-10-11 DIAGNOSIS — R404 Transient alteration of awareness: Secondary | ICD-10-CM | POA: Diagnosis not present

## 2023-10-11 NOTE — Progress Notes (Unsigned)
EEG complete - results pending 

## 2023-10-12 NOTE — Progress Notes (Signed)
EEG is negative, no seizures seen, thanks

## 2023-10-12 NOTE — Procedures (Signed)
ELECTROENCEPHALOGRAM REPORT  Date of Study: 10/11/2023  Patient's Name: [name] MRN: [id number] Date of Birth: [birth year]  Referring Provider: Marlowe Kays, PA-C  Clinical History: This is a 73 year old woman with fall where she did not recall what exactly happened. EEG for classification.  CNS Active Medications: Xanax, Wellbutrin, Sertraline, Trazodone, Donepezil  Technical Summary: A multichannel digital 1-hour EEG recording measured by the international 10-20 system with electrodes applied with paste and impedances below 5000 ohms performed in our laboratory with EKG monitoring in an awake and drowsy patient.  Hyperventilation was not performed. Photic stimulation was performed.  The digital EEG was referentially recorded, reformatted, and digitally filtered in a variety of bipolar and referential montages for optimal display.    Description: The patient is awake and drowsy during the recording.  During maximal wakefulness, there is a symmetric, medium voltage 9-10 Hz posterior dominant rhythm that attenuates with eye opening.  The record is symmetric.  During drowsiness, there is an increase in theta slowing of the background.  Sleep was not captured. Photic stimulation did not elicit any abnormalities.  There were no epileptiform discharges or electrographic seizures seen.    EKG lead was unremarkable.  Impression: This 1-hour awake and drowsy EEG is normal.    Clinical Correlation: A normal EEG does not exclude a clinical diagnosis of epilepsy.  If further clinical questions remain, prolonged EEG may be helpful.  Clinical correlation is advised.   Patrcia Dolly, M.D.

## 2023-10-13 LAB — TSH: TSH: 2.96 m[IU]/L (ref 0.40–4.50)

## 2023-10-13 LAB — VITAMIN B1: Vitamin B1 (Thiamine): 10 nmol/L (ref 8–30)

## 2023-10-13 LAB — VITAMIN B12: Vitamin B-12: 424 pg/mL (ref 200–1100)

## 2023-10-15 NOTE — Progress Notes (Signed)
B1 and B12 are in the lower normal, recommend to start B1 100 mg daily and B12 1000 mcg daily, thyroid levels are normal.

## 2023-11-10 ENCOUNTER — Other Ambulatory Visit: Payer: Self-pay | Admitting: Internal Medicine

## 2023-11-10 DIAGNOSIS — M810 Age-related osteoporosis without current pathological fracture: Secondary | ICD-10-CM | POA: Diagnosis not present

## 2023-11-10 DIAGNOSIS — E669 Obesity, unspecified: Secondary | ICD-10-CM | POA: Diagnosis not present

## 2023-11-10 DIAGNOSIS — F5101 Primary insomnia: Secondary | ICD-10-CM | POA: Diagnosis not present

## 2023-11-10 DIAGNOSIS — M19049 Primary osteoarthritis, unspecified hand: Secondary | ICD-10-CM | POA: Diagnosis not present

## 2023-11-10 DIAGNOSIS — Z23 Encounter for immunization: Secondary | ICD-10-CM | POA: Diagnosis not present

## 2023-11-10 DIAGNOSIS — R413 Other amnesia: Secondary | ICD-10-CM | POA: Diagnosis not present

## 2023-11-10 DIAGNOSIS — Z0000 Encounter for general adult medical examination without abnormal findings: Secondary | ICD-10-CM | POA: Diagnosis not present

## 2023-11-10 DIAGNOSIS — E559 Vitamin D deficiency, unspecified: Secondary | ICD-10-CM | POA: Diagnosis not present

## 2023-11-10 DIAGNOSIS — G4733 Obstructive sleep apnea (adult) (pediatric): Secondary | ICD-10-CM | POA: Diagnosis not present

## 2023-11-10 DIAGNOSIS — E7800 Pure hypercholesterolemia, unspecified: Secondary | ICD-10-CM | POA: Diagnosis not present

## 2023-11-10 DIAGNOSIS — M1990 Unspecified osteoarthritis, unspecified site: Secondary | ICD-10-CM | POA: Diagnosis not present

## 2023-11-10 DIAGNOSIS — F39 Unspecified mood [affective] disorder: Secondary | ICD-10-CM | POA: Diagnosis not present

## 2023-12-26 ENCOUNTER — Ambulatory Visit (INDEPENDENT_AMBULATORY_CARE_PROVIDER_SITE_OTHER): Payer: Medicare Other | Admitting: Physician Assistant

## 2023-12-26 ENCOUNTER — Encounter: Payer: Self-pay | Admitting: Physician Assistant

## 2023-12-26 VITALS — BP 159/100 | Resp 20 | Ht 62.5 in | Wt 161.0 lb

## 2023-12-26 DIAGNOSIS — R413 Other amnesia: Secondary | ICD-10-CM | POA: Diagnosis not present

## 2023-12-26 NOTE — Progress Notes (Signed)
 Assessment/Plan:   Memory Impairment of unclear etiology  [name] is a very pleasant 73 y.o. RH female with a history of hypertension, hyperlipidemia, insomnia, osteoporosis, depression, anxiety  presenting today in follow-up for evaluation of memory loss. Patient is on donepezil 5 mg daily. 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  She is able to participate on her ADLs and drive without difficulties. Mood is good. She tries to remain active      Recommendations:   Follow up in  6 months. Continue donepezil 5 mg daily, side effects discussed Patient has not undergone for MRI of the brain without contrast for further evaluation of any structural abnormalities and to visualize vascular load.  She has been advised to proceed with this imaging Continue B12 and B1 supplementation. Patient is scheduled for neuropsych evaluation in the near future for diagnostic clarity Recommend good control of cardiovascular risk factors. Patient informed of elevated BP Continue to control mood as per PCP    Subjective:   This patient is  here alone . Previous records as well as any outside records available were reviewed prior to todays visit.   Patient was last seen on 10/09/23 with [facility] 21/30    Any changes in memory since last visit? " About the same". "I am aware that I have memory loss".   LTM is good. Likes to reads  the newspaper, read books to her granddaughter, watch the news.  repeats oneself?  Endorsed Disoriented when walking into a room?  Patient denies    Misplacing objects?  Patient denies   Wandering behavior?   denies   Any personality changes since last visit?   denies   Any worsening depression?: denies   Hallucinations or paranoia?  denies   Seizures?   denies    Any sleep changes? Sleeps well with trazodone.  Occasionally she has vivid dreams, denies REM behavior or sleepwalking.   Sleep apnea?   "I do not know".    Any hygiene  concerns?   denies   Independent of bathing and dressing?  Endorsed  Does the patient needs help with medications? Patient is in charge   Who is in charge of the finances?  Patient is in charge     Any changes in appetite?  Denies.     Patient have trouble swallowing?  Denies.   Does the patient cook?  No  Any headaches?    Denies.   Vision changes? Denies. Chronic pain?  Denies.   Ambulates with difficulty?    denies.  She reports being very active 5 days week.    Recent falls or head injuries?  denies      Unilateral weakness, numbness or tingling? denies   Any tremors?  denies   Any anosmia?    denies   Any incontinence of urine?  denies   Any bowel dysfunction?  denies      Patient lives with her husband and her oldest son Does the patient drive?  Yes, she denies any issues She drinks 1 or 2 glasses of wine at night   Initial visit 10/09/2023   How long did patient have memory difficulties?  For about 2-3 years  Patient reports some difficulty remembering new information, recent conversations, names. She attributes it to anesthesia side effects "I had a lot of surgeries".  LTM is "excellent". repeats oneself?  Endorsed, not frequently  Disoriented when walking into a room?  Patient denies. Leaving objects in  unusual places? Denies   Wandering behavior? Denies.   Any personality changes, or depression, anxiety? Denies.  Hallucinations or paranoia? Denies.   Seizures? denies .   Any sleep changes?  Sleeps well, takes trazodone.  Sporadically she reports vivid dreams, REM behavior or sleepwalking   Sleep apnea? Denies.   Any hygiene concerns?  Denies.   Independent of bathing and dressing? Endorsed  Does the patient need help with medications? Patient is in charge   Who is in charge of the finances? Husband and her are in charge     Any changes in appetite?   Denies.     Patient have trouble swallowing?  Denies.   Does the patient cook? No  Any headaches?  Denies.   Chronic  pain? Denies.   Ambulates with difficulty? Denies. She reports being very active.  Recent falls or head injuries?  Endorsed, on NYE, was sitting at the firepit and was ready to go to  sit on the on bed and I don't have any recall, she fell and hit the marble night table with head injury and presented to the ED at [facility], waiting for several hours without being seen, thus, she left.  In the process, she had a CT scan which was negative for acute findings but she has no recall of the incident.  She did have a laceration in his scalp, to the following day she went to [facility] ED for repair of it.  Of note, cardiac workup was negative. Vision changes?  Denies any new issues.    Any strokelike symptoms? Denies.   Any tremors? Denies.   Any anosmia? Denies.   Any incontinence of urine? Denies.   Any bowel dysfunction? Denies.      Patient lives with her husband and oldest son    History of heavy alcohol intake? Drinks 1-2 glasses of wine a night   History of heavy tobacco use? Denies.   Family history of dementia?  Negative for dementia  Does patient drive? Yes, denies any issues. 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[employer], retired 7 yrs ago   EEG is normal (10/11/2023)    Past Medical History:  Diagnosis Date   Depression    Diverticulitis of colon with perforation    DUB (dysfunctional uterine bleeding)    MENOPAUSAL   Elevated blood pressure reading 11/14/2016   Family history of abdominal aortic aneurysm (AAA) 11/14/2016   History of migraine headaches      Past Surgical History:  Procedure Laterality Date   ABDOMINAL HERNIA REPAIR  2009   APPENDECTOMY     CESAREAN SECTION     x2   COLON RESECTION  06/2007   FOR DIVERTICULITIS WITH LEAK   DILATION AND CURETTAGE OF UTERUS       PREVIOUS MEDICATIONS:   CURRENT MEDICATIONS:  Outpatient Encounter Medications as of 12/26/2023  Medication Sig   ALPRAZolam (XANAX) 0.5 MG tablet Take 0.5 mg by mouth at bedtime  as needed for anxiety.   Calcium Carbonate (CALCIUM 600 PO) Take 1 tablet by mouth 2 (two) times daily.   cyanocobalamin (VITAMIN B12) 1000 MCG tablet Take 1,000 mcg by mouth daily.   donepezil (ARICEPT) 5 MG tablet Take 1 tablet (5 mg total) by mouth daily.   sertraline (ZOLOFT) 100 MG tablet TAKE 1 TABLET BY MOUTH DAILY for 90   thiamine (VITAMIN B-1) 100 MG tablet Take 100 mg by mouth daily.   traZODone (DESYREL) 100 MG tablet Take 100 mg  by mouth at bedtime.   vitamin E 400 UNIT capsule Take 400 Units by mouth daily.   acetaminophen (TYLENOL) 325 MG tablet Take 650 mg by mouth every 6 (six) hours as needed.   buPROPion (WELLBUTRIN XL) 300 MG 24 hr tablet Take 300 mg by mouth daily.   No facility-administered encounter medications on file as of 12/26/2023.     Objective:     PHYSICAL EXAMINATION:    VITALS:   Vitals:   12/26/23 1042  BP: (!) 169/101  Resp: 20  SpO2: 96%  Weight: 161 lb (73 kg)  Height: 5' 2.5" (1.588 m)    GEN:  The patient appears stated age and is in NAD. HEENT:  Normocephalic, atraumatic.   Neurological examination:  General: NAD, well-groomed, appears stated age. Orientation: The patient is alert. Oriented to person, place and not to date Cranial nerves: There is good facial symmetry.The speech is fluent and clear. No aphasia or dysarthria. Fund of knowledge is appropriate. Recent memory impaired and remote memory is normal.  Attention and concentration are normal.  Able to name objects and repeat phrases.  Hearing is intact to conversational tone.   Sensation: Sensation is intact to light touch throughout Motor: Strength is at least antigravity x4. DTR's 2/4 in UE/LE      10/10/2023   10:00 AM 10/09/2023    8:00 AM  Montreal Cognitive Assessment   Visuospatial/ Executive (0/5) 3 4  Naming (0/3) 3 3  Attention: Read list of digits (0/2) 2 2  Attention: Read list of letters (0/1) 1 1  Attention: Serial 7 subtraction starting at 100 (0/3) 1 1   Language: Repeat phrase (0/2) 2 2  Language : Fluency (0/1) 1 1  Abstraction (0/2) 2 2  Delayed Recall (0/5) 0 0  Orientation (0/6) 5 5  Total 20 21  Adjusted Score (based on education) 21 22        No data to display             Movement examination: Tone: There is normal tone in the UE/LE Abnormal movements:  no tremor.  No myoclonus.  No asterixis.   Coordination:  There is no decremation with RAM's. Normal finger to nose  Gait and Station: The patient has no difficulty arising out of a deep-seated chair without the use of the hands. The patient's stride length is good.  Gait is cautious and narrow.   Thank you for allowing [place] the opportunity to participate in the care of this nice patient. Please do not hesitate to contact us for any questions or concerns.   Total time spent on today's visit was 30 minutes dedicated to this patient today, preparing to see patient, examining the patient, ordering tests and/or medications and counseling the patient, documenting clinical information in the EHR or other health record, independently interpreting results and communicating results to the patient/family, discussing treatment and goals, answering patient's questions and coordinating care.  Cc:  Ollen Bowl, MD  Marlowe Kays 12/26/2023 11:15 AM

## 2023-12-26 NOTE — Patient Instructions (Signed)

## 2024-01-17 DIAGNOSIS — M1990 Unspecified osteoarthritis, unspecified site: Secondary | ICD-10-CM | POA: Diagnosis not present

## 2024-01-17 DIAGNOSIS — M19049 Primary osteoarthritis, unspecified hand: Secondary | ICD-10-CM | POA: Diagnosis not present

## 2024-01-17 DIAGNOSIS — E669 Obesity, unspecified: Secondary | ICD-10-CM | POA: Diagnosis not present

## 2024-01-17 DIAGNOSIS — M810 Age-related osteoporosis without current pathological fracture: Secondary | ICD-10-CM | POA: Diagnosis not present

## 2024-02-17 DIAGNOSIS — E669 Obesity, unspecified: Secondary | ICD-10-CM | POA: Diagnosis not present

## 2024-02-17 DIAGNOSIS — M1990 Unspecified osteoarthritis, unspecified site: Secondary | ICD-10-CM | POA: Diagnosis not present

## 2024-02-17 DIAGNOSIS — M19049 Primary osteoarthritis, unspecified hand: Secondary | ICD-10-CM | POA: Diagnosis not present

## 2024-02-17 DIAGNOSIS — M810 Age-related osteoporosis without current pathological fracture: Secondary | ICD-10-CM | POA: Diagnosis not present

## 2024-03-20 ENCOUNTER — Ambulatory Visit: Payer: Self-pay

## 2024-03-20 ENCOUNTER — Institutional Professional Consult (permissible substitution): Admitting: Psychology

## 2024-03-28 ENCOUNTER — Encounter: Admitting: Psychology

## 2024-05-01 DIAGNOSIS — R42 Dizziness and giddiness: Secondary | ICD-10-CM | POA: Diagnosis not present

## 2024-05-01 DIAGNOSIS — F39 Unspecified mood [affective] disorder: Secondary | ICD-10-CM | POA: Diagnosis not present

## 2024-05-01 DIAGNOSIS — F5101 Primary insomnia: Secondary | ICD-10-CM | POA: Diagnosis not present

## 2024-05-01 DIAGNOSIS — E7800 Pure hypercholesterolemia, unspecified: Secondary | ICD-10-CM | POA: Diagnosis not present

## 2024-05-01 DIAGNOSIS — M810 Age-related osteoporosis without current pathological fracture: Secondary | ICD-10-CM | POA: Diagnosis not present

## 2024-05-14 DIAGNOSIS — H6993 Unspecified Eustachian tube disorder, bilateral: Secondary | ICD-10-CM | POA: Diagnosis not present

## 2024-05-14 DIAGNOSIS — H8113 Benign paroxysmal vertigo, bilateral: Secondary | ICD-10-CM | POA: Diagnosis not present

## 2024-05-14 DIAGNOSIS — E559 Vitamin D deficiency, unspecified: Secondary | ICD-10-CM | POA: Diagnosis not present

## 2024-05-29 NOTE — Therapy (Signed)
 OUTPATIENT PHYSICAL THERAPY VESTIBULAR EVALUATION     Patient Name: [name] MRN: [id number] DOB:Apr 02, 1951, 73 y.o., female Today's Date: 05/30/2024  END OF SESSION:  PT End of Session - 05/30/24 1102     Visit Number 1    Number of Visits 5    Date for PT Re-Evaluation 06/29/24    Authorization Type MEDICARE    PT Start Time 1102    PT Stop Time 1138    PT Time Calculation (min) 36 min    Activity Tolerance Patient tolerated treatment well    Behavior During Therapy [facility] for tasks assessed/performed          Past Medical History:  Diagnosis Date   Depression    Diverticulitis of colon with perforation    DUB (dysfunctional uterine bleeding)    MENOPAUSAL   Elevated blood pressure reading 11/14/2016   Family history of abdominal aortic aneurysm (AAA) 11/14/2016   History of migraine headaches    Past Surgical History:  Procedure Laterality Date   ABDOMINAL HERNIA REPAIR  2009   APPENDECTOMY     CESAREAN SECTION     x2   COLON RESECTION  06/2007   FOR DIVERTICULITIS WITH LEAK   DILATION AND CURETTAGE OF UTERUS     Patient Active Problem List   Diagnosis Date Noted   Memory impairment 10/09/2023   Family history of abdominal aortic aneurysm (AAA) 11/14/2016   Elevated blood pressure reading 11/14/2016    PCP: Vernon Velna SAUNDERS, MD   REFERRING PROVIDER: Redmon, Noelle, PA  REFERRING DIAG: R42 (ICD-10-CM) - Dizziness and giddiness  THERAPY DIAG:  BPPV (benign paroxysmal positional vertigo), left  Dizziness and giddiness  ONSET DATE: 05/01/2024  Rationale for Evaluation and Treatment: Rehabilitation  SUBJECTIVE:   SUBJECTIVE STATEMENT: About a month ago she woke up and the room started spinning. Does have moments of some dizziness. Has a 90 year old granddaughter that she takes care of, activities of daily living and getting up and down will bring it on. Has dizziness when she gets up. Reports a fullness feeling in her ears, has not seen an ENT. This  sensation has started when the dizziness started. Reports she got Meclizine from the drug store and it is cherry flavored and it does not really help.   Pt accompanied by: self  PERTINENT HISTORY: PMH: osteoporosis, HLD   Vitals:   05/30/24 1115  BP: (!) 159/87  Pulse: 69     PAIN:  Are you having pain? No  PRECAUTIONS: Fall and Other: does have a hx of falls without injury but not in the past 6 months    FALLS: Has patient fallen in last 6 months? No   PLOF: Independent Still driving   PATIENT GOALS: Wants to not be dizzy   OBJECTIVE:  Note: Objective measures were completed at Evaluation unless otherwise noted.   COGNITION: Overall cognitive status: Within functional limits for tasks assessed   Cervical ROM:   WFL  GAIT: Gait pattern: WFL Distance walked: Clinic distances  Assistive device utilized: None Level of assistance: Complete Independence Comments: No unsteadiness noted with gait    VESTIBULAR ASSESSMENT:  GENERAL OBSERVATION: Ambulates in with no AD, independently.    SYMPTOM BEHAVIOR:  Subjective history: See above.   Non-Vestibular symptoms: nausea/vomiting, just a little nausea   Type of dizziness: things feel full in ears  Frequency: Daily  Duration: Won't last an hour   Aggravating factors: Induced by position change: supine to sit and getting on  and off the floor, playing with her granddaughter   Relieving factors: no known relieving factors  Progression of symptoms: unchanged  OCULOMOTOR EXAM:  Ocular Alignment: normal  Ocular ROM: No Limitations  Spontaneous Nystagmus: absent  Gaze-Induced Nystagmus: absent  Smooth Pursuits: intact  Saccades: intact    VESTIBULAR - OCULAR REFLEX:   Slow VOR: Normal  VOR Cancellation: Normal  Head-Impulse Test: HIT Right: negative HIT Left: negative No dizziness     POSITIONAL TESTING: Right Dix-Hallpike: no nystagmus and pt reporting some dizziness when coming up  Left Dix-Hallpike:  upbeating, left nystagmus and lasting approx 15-20 seconds                                                                                                                              TREATMENT DATE: 05/30/24  Self-Care: Provided BPPV education and etiology, purpose of treating with Epley maneuver, discussed trying to sleep on R side for the next couple of days and stayed well hydrated   Canalith Repositioning:  Epley Left: Number of Reps: 1, Response to Treatment: symptoms improved, and Comment: did not get to re-assess as pt wanted to wait until next session as she had to go drive and pick up her granddaughter, pt with no incr in dizziness with return to upright after Epley and able to ambulate out of session independently with no issues    PATIENT EDUCATION: Education details: Clinical findings, POC, BPPV education and provided handout  Person educated: Patient Education method: Explanation, Demonstration, and Handouts Education comprehension: verbalized understanding  HOME EXERCISE PROGRAM: Will provide at future session if needed   GOALS: Goals reviewed with patient? Yes  SHORT TERM GOALS: Target date: ALL STGS = LTGS   LONG TERM GOALS: Target date: 06/27/2024  Pt will demo resolution of L posterior canal BPPV  Baseline: (+) L posterior canalithiasis  Goal status: INITIAL    ASSESSMENT:  CLINICAL IMPRESSION: Patient is a 73 year old female referred to Neuro OPPT for dizziness.   Pt's PMH is significant for: osteoporosis, HLD. The following deficits were present during the exam: L upbeating rotary nystagmus in L DixHallpike indicating L posterior canalithiasis. Treated x1 rep of the Epley maneuver. Did not get to re-assess as pt had to leave afterwards to drive to pick up her granddaughter. Will further assess at next session. Pt able to ambulate out of clinic independently with no issues.  Pt would benefit from skilled PT to address these impairments and decr dizziness.     OBJECTIVE IMPAIRMENTS: decreased activity tolerance and dizziness.   ACTIVITY LIMITATIONS: squatting and bed mobility  PARTICIPATION LIMITATIONS: bending down on the floor to play with her granddaughter   PERSONAL FACTORS: Time since onset of injury/illness/exacerbation and 1-2 comorbidities: osteoporosis, HLD  are also affecting patient's functional outcome.   REHAB POTENTIAL: Good  CLINICAL DECISION MAKING: Stable/uncomplicated  EVALUATION COMPLEXITY: Low   PLAN:  PT FREQUENCY: 1-2x/week  PT DURATION: 4 weeks  PLANNED INTERVENTIONS:  02835- PT Re-evaluation, 97110-Therapeutic exercises, 97530- Therapeutic activity, [id number]- Neuromuscular re-education, 97535- Self Care, [id number]- Manual therapy, [id number]- Canalith repositioning, Balance training, and Vestibular training  PLAN FOR NEXT SESSION: re-check L posterior canal BPPV and treat as needed    [place] LOISE Senate, PT 05/30/2024, 12:00 PM

## 2024-05-30 ENCOUNTER — Encounter: Payer: Self-pay | Admitting: Physical Therapy

## 2024-05-30 ENCOUNTER — Ambulatory Visit: Attending: Physician Assistant | Admitting: Physical Therapy

## 2024-05-30 VITALS — BP 159/87 | HR 69

## 2024-05-30 DIAGNOSIS — R42 Dizziness and giddiness: Secondary | ICD-10-CM | POA: Insufficient documentation

## 2024-05-30 DIAGNOSIS — H8112 Benign paroxysmal vertigo, left ear: Secondary | ICD-10-CM | POA: Insufficient documentation

## 2024-06-04 ENCOUNTER — Encounter: Payer: Self-pay | Admitting: Physical Therapy

## 2024-06-04 ENCOUNTER — Ambulatory Visit: Payer: Self-pay | Admitting: Physical Therapy

## 2024-06-04 DIAGNOSIS — H8112 Benign paroxysmal vertigo, left ear: Secondary | ICD-10-CM | POA: Diagnosis not present

## 2024-06-04 DIAGNOSIS — R42 Dizziness and giddiness: Secondary | ICD-10-CM

## 2024-06-04 NOTE — Therapy (Signed)
 OUTPATIENT PHYSICAL THERAPY VESTIBULAR TREATMENT     Patient Name: [name] MRN: [id number] DOB:1951/06/04, 73 y.o., female Today's Date: 06/04/2024  END OF SESSION:  PT End of Session - 06/04/24 1018     Visit Number 2    Number of Visits 5    Date for PT Re-Evaluation 06/29/24    Authorization Type MEDICARE    PT Start Time 1017    PT Stop Time 1031   full time not used due to resolution of BPPV   PT Time Calculation (min) 14 min    Activity Tolerance Patient tolerated treatment well    Behavior During Therapy [facility] for tasks assessed/performed          Past Medical History:  Diagnosis Date   Depression    Diverticulitis of colon with perforation    DUB (dysfunctional uterine bleeding)    MENOPAUSAL   Elevated blood pressure reading 11/14/2016   Family history of abdominal aortic aneurysm (AAA) 11/14/2016   History of migraine headaches    Past Surgical History:  Procedure Laterality Date   ABDOMINAL HERNIA REPAIR  2009   APPENDECTOMY     CESAREAN SECTION     x2   COLON RESECTION  06/2007   FOR DIVERTICULITIS WITH LEAK   DILATION AND CURETTAGE OF UTERUS     Patient Active Problem List   Diagnosis Date Noted   Memory impairment 10/09/2023   Family history of abdominal aortic aneurysm (AAA) 11/14/2016   Elevated blood pressure reading 11/14/2016    PCP: Vernon Velna SAUNDERS, MD   REFERRING PROVIDER: Redmon, Noelle, PA  REFERRING DIAG: R42 (ICD-10-CM) - Dizziness and giddiness  THERAPY DIAG:  BPPV (benign paroxysmal positional vertigo), left  Dizziness and giddiness  ONSET DATE: 05/01/2024  Rationale for Evaluation and Treatment: Rehabilitation  SUBJECTIVE:   SUBJECTIVE STATEMENT:  Feeling a little better than last week.    Pt accompanied by: self  PERTINENT HISTORY: PMH: osteoporosis, HLD   There were no vitals filed for this visit.    PAIN:  Are you having pain? No  PRECAUTIONS: Fall and Other: does have a hx of falls without injury  but not in the past 6 months    FALLS: Has patient fallen in last 6 months? No   PLOF: Independent Still driving   PATIENT GOALS: Wants to not be dizzy   OBJECTIVE:  Note: Objective measures were completed at Evaluation unless otherwise noted.   COGNITION: Overall cognitive status: Within functional limits for tasks assessed   Cervical ROM:   WFL  GAIT: Gait pattern: WFL Distance walked: Clinic distances  Assistive device utilized: None Level of assistance: Complete Independence Comments: No unsteadiness noted with gait    VESTIBULAR ASSESSMENT:  GENERAL OBSERVATION: Ambulates in with no AD, independently.    SYMPTOM BEHAVIOR:  Subjective history: See above.   Non-Vestibular symptoms: nausea/vomiting, just a little nausea   Type of dizziness: things feel full in ears  Frequency: Daily  Duration: Won't last an hour   Aggravating factors: Induced by position change: supine to sit and getting on and off the floor, playing with her granddaughter   Relieving factors: no known relieving factors  Progression of symptoms: unchanged  OCULOMOTOR EXAM:  Ocular Alignment: normal  Ocular ROM: No Limitations  Spontaneous Nystagmus: absent  Gaze-Induced Nystagmus: absent  Smooth Pursuits: intact  Saccades: intact    VESTIBULAR - OCULAR REFLEX:   Slow VOR: Normal  VOR Cancellation: Normal  Head-Impulse Test: HIT Right: negative HIT Left: negative  No dizziness     POSITIONAL TESTING: Right Dix-Hallpike: no nystagmus and pt reporting some dizziness when coming up  Left Dix-Hallpike: upbeating, left nystagmus and lasting approx 15-20 seconds                                                                                                                              TREATMENT DATE: 06/04/24  Therapeutic Activity: POSITIONAL TESTING: Right Dix-Hallpike: no nystagmus Left Dix-Hallpike: no nystagmus and no dizziness in either position or with return to upright, assessed 2  times   Right Roll Test: no nystagmus Left Roll Test: no nystagmus   Discussed resolution of BPPV at this time. Provided BPPV education and reoccurrence rates. Scheduled appt next week in case, but if pt still not having dizziness then can call and cancel. Provided education of self Epley maneuver to do at home if needed in the future only if BPPV returns or pt can get a new referral to return to PT   PATIENT EDUCATION: Education details: See above, resolution of BPPV  Person educated: Patient Education method: Explanation, Demonstration, and Handouts Education comprehension: verbalized understanding  HOME EXERCISE PROGRAM: Self epley maneuver   GOALS: Goals reviewed with patient? Yes  SHORT TERM GOALS: Target date: ALL STGS = LTGS   LONG TERM GOALS: Target date: 06/27/2024  Pt will demo resolution of L posterior canal BPPV  Baseline: (+) L posterior canalithiasis   Negative on 06/04/24 Goal status: MET    ASSESSMENT:  CLINICAL IMPRESSION: Today's skilled session focused on re-assessing L posterior canal BPPV. Pt negative for BPPV today with all positional testing with pt feeling better. Scheduled an appt just in case for next week, otherwise pt can call and cancel if still not having sx. Provided education/handout on self L Epley maneuver for pt to perform as needed in the future or can get a new referral to return to PT.   OBJECTIVE IMPAIRMENTS: decreased activity tolerance and dizziness.   ACTIVITY LIMITATIONS: squatting and bed mobility  PARTICIPATION LIMITATIONS: bending down on the floor to play with her granddaughter   PERSONAL FACTORS: Time since onset of injury/illness/exacerbation and 1-2 comorbidities: osteoporosis, HLD  are also affecting patient's functional outcome.   REHAB POTENTIAL: Good  CLINICAL DECISION MAKING: Stable/uncomplicated  EVALUATION COMPLEXITY: Low   PLAN:  PT FREQUENCY: 1-2x/week  PT DURATION: 4 weeks  PLANNED INTERVENTIONS: 97164-  PT Re-evaluation, 97110-Therapeutic exercises, 97530- Therapeutic activity, [id number]- Neuromuscular re-education, 97535- Self Care, 02859- Manual therapy, 95992- Canalith repositioning, Balance training, and Vestibular training  PLAN FOR NEXT SESSION: did BPPV return? If not D/C from PT    [place] LOISE Senate, PT, DPT 06/04/2024, 10:40 AM

## 2024-06-06 ENCOUNTER — Encounter: Payer: Self-pay | Admitting: Physical Therapy

## 2024-06-12 ENCOUNTER — Ambulatory Visit: Payer: Self-pay | Admitting: Physical Therapy

## 2024-06-27 ENCOUNTER — Ambulatory Visit: Admitting: Physician Assistant

## 2024-08-02 DIAGNOSIS — Z23 Encounter for immunization: Secondary | ICD-10-CM | POA: Diagnosis not present

## 2024-08-29 ENCOUNTER — Ambulatory Visit: Payer: Self-pay

## 2024-08-29 ENCOUNTER — Institutional Professional Consult (permissible substitution): Payer: Medicare Other | Admitting: Psychology

## 2024-09-24 ENCOUNTER — Encounter: Payer: Medicare Other | Admitting: Psychology
